# Patient Record
Sex: Female | Born: 1945 | Race: White | Hispanic: No | Marital: Married | Smoking: Never smoker
Health system: Southern US, Community
[De-identification: ages and names within clinical notes are randomized; demographics above are authoritative.]

## PROBLEM LIST (undated history)

## (undated) DIAGNOSIS — Z9989 Dependence on other enabling machines and devices: Secondary | ICD-10-CM

## (undated) DIAGNOSIS — F329 Major depressive disorder, single episode, unspecified: Secondary | ICD-10-CM

## (undated) DIAGNOSIS — K219 Gastro-esophageal reflux disease without esophagitis: Secondary | ICD-10-CM

## (undated) DIAGNOSIS — E669 Obesity, unspecified: Secondary | ICD-10-CM

## (undated) DIAGNOSIS — G4733 Obstructive sleep apnea (adult) (pediatric): Secondary | ICD-10-CM

## (undated) DIAGNOSIS — E668 Other obesity: Secondary | ICD-10-CM

## (undated) DIAGNOSIS — J3800 Paralysis of vocal cords and larynx, unspecified: Secondary | ICD-10-CM

## (undated) DIAGNOSIS — G4730 Sleep apnea, unspecified: Secondary | ICD-10-CM

## (undated) DIAGNOSIS — Q250 Patent ductus arteriosus: Secondary | ICD-10-CM

## (undated) DIAGNOSIS — T4145XA Adverse effect of unspecified anesthetic, initial encounter: Secondary | ICD-10-CM

## (undated) DIAGNOSIS — T8859XA Other complications of anesthesia, initial encounter: Secondary | ICD-10-CM

## (undated) DIAGNOSIS — Z87898 Personal history of other specified conditions: Secondary | ICD-10-CM

## (undated) DIAGNOSIS — F32A Depression, unspecified: Secondary | ICD-10-CM

## (undated) DIAGNOSIS — E785 Hyperlipidemia, unspecified: Secondary | ICD-10-CM

## (undated) DIAGNOSIS — Z972 Presence of dental prosthetic device (complete) (partial): Secondary | ICD-10-CM

## (undated) HISTORY — PX: PATENT DUCTUS ARTERIOUS REPAIR: SHX269

## (undated) HISTORY — DX: Personal history of other specified conditions: Z87.898

## (undated) HISTORY — DX: Other obesity: E66.8

## (undated) HISTORY — PX: DILATION AND CURETTAGE OF UTERUS: SHX78

## (undated) HISTORY — DX: Obesity, unspecified: E66.9

## (undated) HISTORY — DX: Obstructive sleep apnea (adult) (pediatric): Z99.89

## (undated) HISTORY — DX: Depression, unspecified: F32.A

## (undated) HISTORY — DX: Obstructive sleep apnea (adult) (pediatric): G47.33

## (undated) HISTORY — PX: OTHER SURGICAL HISTORY: SHX169

## (undated) HISTORY — DX: Hyperlipidemia, unspecified: E78.5

## (undated) HISTORY — PX: ABDOMINAL HYSTERECTOMY: SHX81

---
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
## 1999-02-05 ENCOUNTER — Encounter: Payer: Self-pay | Admitting: Gynecology

## 1999-02-05 ENCOUNTER — Encounter: Admission: RE | Admit: 1999-02-05 | Discharge: 1999-02-05 | Payer: Self-pay | Admitting: Gynecology

## 2000-02-24 ENCOUNTER — Other Ambulatory Visit: Admission: RE | Admit: 2000-02-24 | Discharge: 2000-02-24 | Payer: Self-pay | Admitting: Gynecology

## 2000-08-03 ENCOUNTER — Encounter: Admission: RE | Admit: 2000-08-03 | Discharge: 2000-08-03 | Payer: Self-pay | Admitting: Gynecology

## 2000-08-03 ENCOUNTER — Encounter: Payer: Self-pay | Admitting: Gynecology

## 2001-11-11 ENCOUNTER — Encounter: Admission: RE | Admit: 2001-11-11 | Discharge: 2001-11-11 | Payer: Self-pay | Admitting: Gynecology

## 2001-11-11 ENCOUNTER — Encounter: Payer: Self-pay | Admitting: Gynecology

## 2003-02-21 ENCOUNTER — Encounter: Admission: RE | Admit: 2003-02-21 | Discharge: 2003-02-21 | Payer: Self-pay | Admitting: Gynecology

## 2003-04-24 ENCOUNTER — Other Ambulatory Visit: Admission: RE | Admit: 2003-04-24 | Discharge: 2003-04-24 | Payer: Self-pay | Admitting: Gynecology

## 2004-09-26 ENCOUNTER — Encounter: Admission: RE | Admit: 2004-09-26 | Discharge: 2004-09-26 | Payer: Self-pay | Admitting: Gynecology

## 2005-09-29 ENCOUNTER — Encounter: Admission: RE | Admit: 2005-09-29 | Discharge: 2005-09-29 | Payer: Self-pay | Admitting: Gynecology

## 2006-11-17 ENCOUNTER — Encounter: Admission: RE | Admit: 2006-11-17 | Discharge: 2006-11-17 | Payer: Self-pay | Admitting: Internal Medicine

## 2007-11-22 ENCOUNTER — Encounter: Admission: RE | Admit: 2007-11-22 | Discharge: 2007-11-22 | Payer: Self-pay | Admitting: Internal Medicine

## 2008-01-03 ENCOUNTER — Encounter: Admission: RE | Admit: 2008-01-03 | Discharge: 2008-01-03 | Payer: Self-pay | Admitting: Internal Medicine

## 2008-11-22 ENCOUNTER — Encounter: Admission: RE | Admit: 2008-11-22 | Discharge: 2008-11-22 | Payer: Self-pay | Admitting: Internal Medicine

## 2009-11-26 ENCOUNTER — Encounter: Admission: RE | Admit: 2009-11-26 | Discharge: 2009-11-26 | Payer: Self-pay | Admitting: Gynecology

## 2010-10-30 ENCOUNTER — Other Ambulatory Visit: Payer: Self-pay | Admitting: Gynecology

## 2010-10-30 DIAGNOSIS — Z1231 Encounter for screening mammogram for malignant neoplasm of breast: Secondary | ICD-10-CM

## 2010-11-29 ENCOUNTER — Ambulatory Visit
Admission: RE | Admit: 2010-11-29 | Discharge: 2010-11-29 | Disposition: A | Discharge status: Home / Self Care | Payer: BC Managed Care – PPO | Source: Ambulatory Visit | Attending: Gynecology | Admitting: Gynecology

## 2010-11-29 DIAGNOSIS — Z1231 Encounter for screening mammogram for malignant neoplasm of breast: Secondary | ICD-10-CM

## 2011-10-27 ENCOUNTER — Other Ambulatory Visit: Payer: Self-pay | Admitting: Internal Medicine

## 2011-10-27 DIAGNOSIS — Z1231 Encounter for screening mammogram for malignant neoplasm of breast: Secondary | ICD-10-CM

## 2011-12-01 ENCOUNTER — Ambulatory Visit
Admission: RE | Admit: 2011-12-01 | Discharge: 2011-12-01 | Disposition: A | Discharge status: Home / Self Care | Payer: BC Managed Care – PPO | Source: Ambulatory Visit | Attending: Internal Medicine | Admitting: Internal Medicine

## 2011-12-01 DIAGNOSIS — Z1231 Encounter for screening mammogram for malignant neoplasm of breast: Secondary | ICD-10-CM

## 2011-12-03 ENCOUNTER — Other Ambulatory Visit: Payer: Self-pay | Admitting: Internal Medicine

## 2011-12-03 DIAGNOSIS — R928 Other abnormal and inconclusive findings on diagnostic imaging of breast: Secondary | ICD-10-CM

## 2011-12-09 ENCOUNTER — Ambulatory Visit
Admission: RE | Admit: 2011-12-09 | Discharge: 2011-12-09 | Disposition: A | Discharge status: Home / Self Care | Payer: BC Managed Care – PPO | Source: Ambulatory Visit | Attending: Internal Medicine | Admitting: Internal Medicine

## 2011-12-09 DIAGNOSIS — R928 Other abnormal and inconclusive findings on diagnostic imaging of breast: Secondary | ICD-10-CM

## 2012-01-01 ENCOUNTER — Encounter (HOSPITAL_COMMUNITY): Payer: Self-pay | Admitting: Pharmacy Technician

## 2012-01-02 ENCOUNTER — Encounter (HOSPITAL_COMMUNITY): Payer: Self-pay | Admitting: *Deleted

## 2012-01-06 ENCOUNTER — Encounter (HOSPITAL_COMMUNITY): Payer: Self-pay | Admitting: *Deleted

## 2012-01-06 ENCOUNTER — Encounter (HOSPITAL_COMMUNITY)
Admission: RE | Disposition: A | Discharge status: Home / Self Care | Payer: Self-pay | Source: Ambulatory Visit | Attending: Gastroenterology

## 2012-01-06 ENCOUNTER — Ambulatory Visit (HOSPITAL_COMMUNITY)
Admission: RE | Admit: 2012-01-06 | Discharge: 2012-01-06 | Disposition: A | Discharge status: Home / Self Care | Payer: BC Managed Care – PPO | Source: Ambulatory Visit | Attending: Gastroenterology | Admitting: Gastroenterology

## 2012-01-06 ENCOUNTER — Encounter (HOSPITAL_COMMUNITY): Payer: Self-pay | Admitting: Anesthesiology

## 2012-01-06 ENCOUNTER — Ambulatory Visit (HOSPITAL_COMMUNITY): Payer: BC Managed Care – PPO | Admitting: Anesthesiology

## 2012-01-06 DIAGNOSIS — Z79899 Other long term (current) drug therapy: Secondary | ICD-10-CM | POA: Insufficient documentation

## 2012-01-06 DIAGNOSIS — G4733 Obstructive sleep apnea (adult) (pediatric): Secondary | ICD-10-CM | POA: Insufficient documentation

## 2012-01-06 DIAGNOSIS — Z1211 Encounter for screening for malignant neoplasm of colon: Secondary | ICD-10-CM | POA: Insufficient documentation

## 2012-01-06 DIAGNOSIS — K219 Gastro-esophageal reflux disease without esophagitis: Secondary | ICD-10-CM | POA: Insufficient documentation

## 2012-01-06 HISTORY — DX: Other complications of anesthesia, initial encounter: T88.59XA

## 2012-01-06 HISTORY — DX: Patent ductus arteriosus: Q25.0

## 2012-01-06 HISTORY — DX: Sleep apnea, unspecified: G47.30

## 2012-01-06 HISTORY — DX: Gastro-esophageal reflux disease without esophagitis: K21.9

## 2012-01-06 HISTORY — DX: Paralysis of vocal cords and larynx, unspecified: J38.00

## 2012-01-06 HISTORY — PX: COLONOSCOPY WITH PROPOFOL: SHX5780

## 2012-01-06 HISTORY — DX: Adverse effect of unspecified anesthetic, initial encounter: T41.45XA

## 2012-01-06 SURGERY — COLONOSCOPY WITH PROPOFOL
Anesthesia: Monitor Anesthesia Care

## 2012-01-06 MED ORDER — KETAMINE HCL 10 MG/ML IJ SOLN
INTRAMUSCULAR | Status: DC | PRN
  Administered 2012-01-06: 10 mg via INTRAVENOUS

## 2012-01-06 MED ORDER — SODIUM CHLORIDE 0.9 % IV SOLN: INTRAVENOUS | Status: DC

## 2012-01-06 MED ORDER — PROMETHAZINE HCL 25 MG/ML IJ SOLN: 6.2500 mg | INTRAMUSCULAR | Status: DC | PRN

## 2012-01-06 MED ORDER — LACTATED RINGERS IV SOLN: INTRAVENOUS | Status: DC

## 2012-01-06 MED ORDER — FENTANYL CITRATE 0.05 MG/ML IJ SOLN
INTRAMUSCULAR | Status: DC | PRN
  Administered 2012-01-06: 50 ug via INTRAVENOUS

## 2012-01-06 MED ORDER — LACTATED RINGERS IV SOLN
INTRAVENOUS | Status: DC | PRN
  Administered 2012-01-06: 13:00:00 via INTRAVENOUS

## 2012-01-06 MED ORDER — PROPOFOL 10 MG/ML IV EMUL
INTRAVENOUS | Status: DC | PRN
  Administered 2012-01-06: 120 ug/kg/min via INTRAVENOUS

## 2012-01-06 MED ORDER — MEPERIDINE HCL 100 MG/ML IJ SOLN: 6.2500 mg | INTRAMUSCULAR | Status: DC | PRN

## 2012-01-06 MED ORDER — MIDAZOLAM HCL 5 MG/5ML IJ SOLN
INTRAMUSCULAR | Status: DC | PRN
  Administered 2012-01-06: 2 mg via INTRAVENOUS

## 2012-01-06 SURGICAL SUPPLY — 21 items

## 2012-01-06 NOTE — Op Note (Signed)
Procedure: Screening colonoscopy  Endoscopist: Danise Edge  Premedication: Propofol administered by anesthesia  Procedure: The patient was placed in the left lateral decubitus position. Anal inspection and digital rectal exam were normal. The Pentax pediatric colonoscope was introduced into the rectum and easily advanced to the cecum. A normal-appearing ileocecal valve and appendiceal orifice were identified. Colonic preparation for the exam today was good.  Rectum. Normal. Retroflexed view of the distal rectum normal.  Sigmoid colon and descending colon. Normal.  Splenic flexure. Normal.  Transverse colon. Normal.  Hepatic flexure. Normal.  Ascending colon. Normal.  Cecum and ileocecal valve. Normal.  Assessment: Normal screening proctocolonoscopy to the cecum.  Recommendation: Schedule repeat screening colonoscopy in 10 years.

## 2012-01-06 NOTE — Transfer of Care (Signed)
Immediate Anesthesia Transfer of Care Note  Patient: Isabel Fleming  Procedure(s) Performed: Procedure(s) (LRB) with comments: COLONOSCOPY WITH PROPOFOL (N/A)  Patient Location: PACU  Anesthesia Type:MAC  Level of Consciousness: awake, alert , oriented and patient cooperative  Airway & Oxygen Therapy: Patient Spontanous Breathing and Patient connected to face mask oxygen  Post-op Assessment: Report given to PACU RN and Post -op Vital signs reviewed and stable  Post vital signs: Reviewed and stable  Complications: No apparent anesthesia complications

## 2012-01-06 NOTE — Anesthesia Postprocedure Evaluation (Signed)
  Anesthesia Post-op Note  Patient: Isabel Fleming  Procedure(s) Performed: Procedure(s) (LRB): COLONOSCOPY WITH PROPOFOL (N/A)  Patient Location: PACU  Anesthesia Type: MAC  Level of Consciousness: awake and alert   Airway and Oxygen Therapy: Patient Spontanous Breathing  Post-op Pain: mild  Post-op Assessment: Post-op Vital signs reviewed, Patient's Cardiovascular Status Stable, Respiratory Function Stable, Patent Airway and No signs of Nausea or vomiting  Post-op Vital Signs: stable  Complications: No apparent anesthesia complications

## 2012-01-06 NOTE — H&P (Signed)
  Procedure: Screening colonoscopy.  History: The patient is a 66 year old female born [birth year]. The patient is scheduled to undergo a repeat screening colonoscopy with polypectomy to prevent colon cancer. She underwent a normal screening colonoscopy in September 2002.  I discussed with the patient the complications associated with screening colonoscopy including intestinal bleeding, intestinal perforation, and oversedation.  Medication allergies: None.  Chronic medications: Loratadine. Fish oil. Fluticasone nasal spray. Omeprazole. Multivitamin.  Past medical and surgical history: Hysterectomy. Oophorectomy. D&C. Obstructive sleep apnea. Depression. Chronic hoarseness. Vertigo.  Exam: The patient is alert and lying comfortably on the endoscopy stretcher. Sclera are nonicteric. Lungs are clear to auscultation. Cardiac exam reveals a regular rhythm. Abdomen is soft, flat, and nontender to palpation in all quadrants.  Plan: Proceed with screening colonoscopy using propofol sedation as scheduled.

## 2012-01-06 NOTE — Anesthesia Preprocedure Evaluation (Addendum)
Anesthesia Evaluation  Patient identified by MRN, date of birth, ID band Patient awake    Reviewed: Allergy & Precautions, H&P , NPO status , Patient's Chart, lab work & pertinent test results  History of Anesthesia Complications Negative for: history of anesthetic complications  Airway Mallampati: II TM Distance: >3 FB Neck ROM: Full    Dental No notable dental hx. (+) Edentulous Upper and Upper Dentures   Pulmonary neg pulmonary ROS, sleep apnea and Continuous Positive Airway Pressure Ventilation ,  breath sounds clear to auscultation  Pulmonary exam normal       Cardiovascular negative cardio ROS  Rhythm:Regular Rate:Normal  S/p PDA repair.   Neuro/Psych Paralyzed vocal cord negative neurological ROS  negative psych ROS   GI/Hepatic negative GI ROS, Neg liver ROS, GERD- (mild)  ,  Endo/Other  negative endocrine ROS  Renal/GU negative Renal ROS  negative genitourinary   Musculoskeletal negative musculoskeletal ROS (+)   Abdominal   Peds negative pediatric ROS (+)  Hematology negative hematology ROS (+)   Anesthesia Other Findings   Reproductive/Obstetrics negative OB ROS                           Anesthesia Physical Anesthesia Plan  ASA: II  Anesthesia Plan: MAC   Post-op Pain Management:    Induction: Intravenous  Airway Management Planned: Simple Face Mask  Additional Equipment:   Intra-op Plan:   Post-operative Plan: Extubation in OR  Informed Consent: I have reviewed the patients History and Physical, chart, labs and discussed the procedure including the risks, benefits and alternatives for the proposed anesthesia with the patient or authorized representative who has indicated his/her understanding and acceptance.   Dental advisory given  Plan Discussed with: CRNA  Anesthesia Plan Comments:         Anesthesia Quick Evaluation

## 2012-01-06 NOTE — Preoperative (Signed)
Beta Blockers   Reason not to administer Beta Blockers:Not Applicable 

## 2012-01-08 ENCOUNTER — Encounter (HOSPITAL_COMMUNITY): Payer: Self-pay | Admitting: Gastroenterology

## 2012-07-13 ENCOUNTER — Encounter (HOSPITAL_BASED_OUTPATIENT_CLINIC_OR_DEPARTMENT_OTHER): Payer: Self-pay | Admitting: *Deleted

## 2012-07-13 NOTE — Progress Notes (Signed)
No heart or resp problems-does use a cpap and will bring dos and use post op

## 2012-07-14 NOTE — H&P (Signed)
[facility] [address]   [address] [place], [place] [address] [phone] A Division of [facility]  Loreta Ave, M.D.   Robert A. Thurston Hole, M.D.   Burnell Blanks, M.D.   Eulas Post, M.D.   Lunette Stands, M.D Buford Dresser, M.D.  Charlsie Quest, M.D.   Estell Harpin, M.D.   Melina Fiddler, M.D. Genene Churn. Barry Dienes, PA-C            Kirstin A. Shepperson, PA-C Josh Clearfield, PA-C [place], [place]   RE: Isabel Fleming, Isabel Fleming                                [phone]      DOB: 02-07-1946 PROGRESS NOTE: 06-01-12 [age] year-old white female who comes into the office today with complaints of left knee pain and right shoulder pain.  Last seen for left knee on January 13, 2012 and we had discussed getting an MRI scan if she continued to be symptomatic.  Continues to complain of medial knee pain with squatting, pivoting and stairs.  Some feeling of catching and locking.  Right shoulder pain ongoing for about a month and started after she had been doing some raking.  Pain started a few hours after the activity.  Pain currently with overhead activity and reaching behind her back.  Pain does awaken her at night when she lies on her right shoulder.  No cervical spine or radicular component.  No feeling of instability.  No previous problems before onset.    EXAMINATION: Pleasant white female, alert and oriented x 3 and in no acute distress.  No increase in respiratory effort.  Cervical spine unremarkable.  Right shoulder she has good range of motion, but with discomfort.  Positive impingement test.  Negative drop arm.  Pain with supraspinatus resistance.  Left knee good range of motion.  Some swelling without large effusion.  She is exquisitely tender at the medial joint line.  Positive McMurray's.  Ligaments stable.  Bilateral calves non-tender.  Neurovascularly intact.  Skin warm and dry.   X-RAYS: Right shoulder, AP, outlet  and axillary views, show a Type II acromion with some AC degenerative changes.  No acute changes.    IMPRESSION: 1. Right shoulder pain secondary to impingement.  2. Left knee pain, possibly due to medial meniscus tear.   PLAN:  For her knee we will schedule an MRI scan to rule out medial meniscus tear and she will follow up after to discuss results and treatment options.  We offered conservative treatment for her shoulder with injection.  We will recheck her shoulder when she follows up for her knee.  All questions answered.    PROCEDURE NOTE: The patient's clinical condition is marked by substantial pain and/or significant functional disability.  Other conservative therapy has not provided relief, is contraindicated, or not appropriate.  There is a reasonable likelihood that injection will significantly improve the patient's pain and/or functional disability. After patient consent the right shoulder was prepped with Betadine after using 3 cc of 1% Xylocaine for local anesthetic, subacromial 1:4 Depo-Medrol/Marcaine injection performed from a posterolateral approach.  Tolerated procedure well without complication.   Loreta Ave, M.D.   Electronically verified by Loreta Ave, M.D. DFM(JMO):jjh D 06-02-12 T 06-03-12  [facility] [address]   [address] [place], [place] [address] [phone] A Division  of [facility]  Loreta Ave, M.D.   Robert A. Thurston Hole, M.D.   Burnell Blanks, M.D.   Eulas Post, M.D.   Lunette Stands, M.D Buford Dresser, M.D.  Charlsie Quest, M.D.   Estell Harpin, M.D.   Melina Fiddler, M.D. Genene Churn. Barry Dienes, PA-C            Kirstin A. Shepperson, PA-C Josh [place], PA-C [place], [place]   RE: Isabel Fleming, Isabel Fleming                                [phone]      DOB: August 19, 1945 PROGRESS NOTE: 06-11-12 [age] year-old white female with a history of left knee pain and right  shoulder pain.  Returns.  Left knee MRI scan on June 04, 2012 showed complex tear of the body of the medial meniscus with extrusion.  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  Knee symptoms are unchanged from previous visit.  She continues to have ongoing pain and a feeling of mechanical symptoms.  Right shoulder is feeling much better after subacromial Depo-Medrol/Marcaine injection.    EXAMINATION: Pleasant white female, alert and oriented x 3 and in no acute distress.  Right shoulder she has good range of motion.  Left knee less tender with impingement testing.  Negative drop arm.  Left knee exam is unchanged from previous visit.    DISPOSITION:  For her knee, patient advised that the best treatment option at this point would be outpatient arthroscopy with debridement and partial medial meniscectomy.  Understands that we must be cautiously optimistic due to the degenerative changes that she has.  Surgical procedure, along with potential rehab/recovery time discussed.  Pre-op paperwork filled out.  We will continue to keep an eye on her shoulder and if this becomes more symptomatic we did discuss scheduling an MRI scan to better evaluate her rotator cuff.  All questions answered.    Loreta Ave, M.D.   Electronically verified by Loreta Ave, M.D. DFM(JMO):jjh D 06-14-12 T 06-14-12

## 2012-07-15 ENCOUNTER — Encounter (HOSPITAL_BASED_OUTPATIENT_CLINIC_OR_DEPARTMENT_OTHER)
Admission: RE | Disposition: A | Discharge status: Home / Self Care | Payer: Self-pay | Source: Ambulatory Visit | Attending: Orthopedic Surgery

## 2012-07-15 ENCOUNTER — Ambulatory Visit (HOSPITAL_BASED_OUTPATIENT_CLINIC_OR_DEPARTMENT_OTHER)
Admission: RE | Admit: 2012-07-15 | Discharge: 2012-07-15 | Disposition: A | Discharge status: Home / Self Care | Payer: BC Managed Care – PPO | Source: Ambulatory Visit | Attending: Orthopedic Surgery | Admitting: Orthopedic Surgery

## 2012-07-15 ENCOUNTER — Encounter (HOSPITAL_BASED_OUTPATIENT_CLINIC_OR_DEPARTMENT_OTHER): Payer: Self-pay | Admitting: Anesthesiology

## 2012-07-15 ENCOUNTER — Ambulatory Visit (HOSPITAL_BASED_OUTPATIENT_CLINIC_OR_DEPARTMENT_OTHER): Payer: BC Managed Care – PPO | Admitting: Anesthesiology

## 2012-07-15 DIAGNOSIS — I2510 Atherosclerotic heart disease of native coronary artery without angina pectoris: Secondary | ICD-10-CM | POA: Insufficient documentation

## 2012-07-15 DIAGNOSIS — K219 Gastro-esophageal reflux disease without esophagitis: Secondary | ICD-10-CM | POA: Insufficient documentation

## 2012-07-15 DIAGNOSIS — M6750 Plica syndrome, unspecified knee: Secondary | ICD-10-CM | POA: Insufficient documentation

## 2012-07-15 DIAGNOSIS — M1710 Unilateral primary osteoarthritis, unspecified knee: Secondary | ICD-10-CM | POA: Insufficient documentation

## 2012-07-15 DIAGNOSIS — IMO0002 Reserved for concepts with insufficient information to code with codable children: Secondary | ICD-10-CM | POA: Insufficient documentation

## 2012-07-15 DIAGNOSIS — S83289A Other tear of lateral meniscus, current injury, unspecified knee, initial encounter: Secondary | ICD-10-CM | POA: Insufficient documentation

## 2012-07-15 DIAGNOSIS — G4730 Sleep apnea, unspecified: Secondary | ICD-10-CM | POA: Insufficient documentation

## 2012-07-15 DIAGNOSIS — X58XXXA Exposure to other specified factors, initial encounter: Secondary | ICD-10-CM | POA: Insufficient documentation

## 2012-07-15 DIAGNOSIS — M25819 Other specified joint disorders, unspecified shoulder: Secondary | ICD-10-CM | POA: Insufficient documentation

## 2012-07-15 DIAGNOSIS — Z9889 Other specified postprocedural states: Secondary | ICD-10-CM

## 2012-07-15 HISTORY — DX: Presence of dental prosthetic device (complete) (partial): Z97.2

## 2012-07-15 HISTORY — PX: KNEE ARTHROSCOPY WITH MEDIAL MENISECTOMY: SHX5651

## 2012-07-15 LAB — POCT HEMOGLOBIN-HEMACUE: Hemoglobin: 13.2 g/dL (ref 12.0–15.0)

## 2012-07-15 SURGERY — ARTHROSCOPY, KNEE, WITH MEDIAL MENISCECTOMY
Anesthesia: General | Site: Knee | Laterality: Left | Wound class: Clean

## 2012-07-15 MED ORDER — FENTANYL CITRATE 0.05 MG/ML IJ SOLN
INTRAMUSCULAR | Status: DC | PRN
  Administered 2012-07-15: 100 ug via INTRAVENOUS
  Administered 2012-07-15: 25 ug via INTRAVENOUS

## 2012-07-15 MED ORDER — CEFAZOLIN SODIUM-DEXTROSE 2-3 GM-% IV SOLR
2.0000 g | INTRAVENOUS | Status: AC
  Administered 2012-07-15: 2 g via INTRAVENOUS

## 2012-07-15 MED ORDER — ONDANSETRON HCL 4 MG/2ML IJ SOLN
INTRAMUSCULAR | Status: DC | PRN
  Administered 2012-07-15: 4 mg via INTRAVENOUS

## 2012-07-15 MED ORDER — HYDROMORPHONE HCL PF 1 MG/ML IJ SOLN
0.2500 mg | INTRAMUSCULAR | Status: DC | PRN
  Administered 2012-07-15: 0.25 mg via INTRAVENOUS

## 2012-07-15 MED ORDER — PROPOFOL 10 MG/ML IV BOLUS
INTRAVENOUS | Status: DC | PRN
  Administered 2012-07-15: 150 mg via INTRAVENOUS

## 2012-07-15 MED ORDER — LACTATED RINGERS IV SOLN
INTRAVENOUS | Status: DC
  Administered 2012-07-15: 07:00:00 via INTRAVENOUS

## 2012-07-15 MED ORDER — MIDAZOLAM HCL 5 MG/5ML IJ SOLN
INTRAMUSCULAR | Status: DC | PRN
  Administered 2012-07-15: 2 mg via INTRAVENOUS

## 2012-07-15 MED ORDER — BUPIVACAINE HCL (PF) 0.5 % IJ SOLN
INTRAMUSCULAR | Status: DC | PRN
  Administered 2012-07-15: 20 mL

## 2012-07-15 MED ORDER — SODIUM CHLORIDE 0.9 % IR SOLN
Status: DC | PRN
  Administered 2012-07-15: 6000 mL

## 2012-07-15 MED ORDER — METHYLPREDNISOLONE ACETATE 80 MG/ML IJ SUSP
INTRAMUSCULAR | Status: DC | PRN
  Administered 2012-07-15: 08:00:00 via INTRA_ARTICULAR

## 2012-07-15 MED ORDER — KETOROLAC TROMETHAMINE 30 MG/ML IJ SOLN
INTRAMUSCULAR | Status: DC | PRN
  Administered 2012-07-15: 30 mg via INTRAVENOUS

## 2012-07-15 MED ORDER — OXYCODONE-ACETAMINOPHEN 5-325 MG PO TABS
1.0000 | ORAL_TABLET | ORAL | Status: DC | PRN
  Administered 2012-07-15: 1 via ORAL

## 2012-07-15 MED ORDER — OXYCODONE-ACETAMINOPHEN 5-325 MG PO TABS: 1.0000 | ORAL_TABLET | ORAL | Status: DC | PRN

## 2012-07-15 MED ORDER — LIDOCAINE HCL (CARDIAC) 20 MG/ML IV SOLN
INTRAVENOUS | Status: DC | PRN
  Administered 2012-07-15: 60 mg via INTRAVENOUS

## 2012-07-15 MED ORDER — DEXAMETHASONE SODIUM PHOSPHATE 4 MG/ML IJ SOLN
INTRAMUSCULAR | Status: DC | PRN
  Administered 2012-07-15: 8 mg via INTRAVENOUS

## 2012-07-15 SURGICAL SUPPLY — 39 items
BANDAGE ELASTIC 6 VELCRO ST LF (GAUZE/BANDAGES/DRESSINGS) ×2 IMPLANT
BLADE CUDA 5.5 (BLADE) IMPLANT
BLADE CUDA GRT WHITE 3.5 (BLADE) IMPLANT
BLADE CUTTER GATOR 3.5 (BLADE) ×2 IMPLANT
BLADE CUTTER MENIS 5.5 (BLADE) IMPLANT
BLADE GREAT WHITE 4.2 (BLADE) ×2 IMPLANT
BUR OVAL 4.0 (BURR) IMPLANT
CANISTER OMNI JUG 16 LITER (MISCELLANEOUS) ×2 IMPLANT
CANISTER SUCTION 2500CC (MISCELLANEOUS) IMPLANT
CLOTH BEACON ORANGE TIMEOUT ST (SAFETY) ×2 IMPLANT
CUTTER MENISCUS  4.2MM (BLADE)
CUTTER MENISCUS 4.2MM (BLADE) IMPLANT
DRAPE ARTHROSCOPY W/POUCH 90 (DRAPES) ×2 IMPLANT
DURAPREP 26ML APPLICATOR (WOUND CARE) ×2 IMPLANT
ELECT MENISCUS 165MM 90D (ELECTRODE) IMPLANT
ELECT REM PT RETURN 9FT ADLT (ELECTROSURGICAL) ×2
ELECTRODE REM PT RTRN 9FT ADLT (ELECTROSURGICAL) ×1 IMPLANT
GAUZE XEROFORM 1X8 LF (GAUZE/BANDAGES/DRESSINGS) ×2 IMPLANT
GLOVE BIO SURGEON STRL SZ 6.5 (GLOVE) ×2 IMPLANT
GLOVE BIOGEL PI IND STRL 7.0 (GLOVE) ×1 IMPLANT
GLOVE BIOGEL PI IND STRL 8 (GLOVE) ×1 IMPLANT
GLOVE BIOGEL PI INDICATOR 7.0 (GLOVE) ×1
GLOVE BIOGEL PI INDICATOR 8 (GLOVE) ×1
GLOVE ORTHO TXT STRL SZ7.5 (GLOVE) ×4 IMPLANT
GOWN PREVENTION PLUS XLARGE (GOWN DISPOSABLE) IMPLANT
GOWN STRL REIN 2XL XLG LVL4 (GOWN DISPOSABLE) IMPLANT
HOLDER KNEE FOAM BLUE (MISCELLANEOUS) ×2 IMPLANT
IV NS IRRIG 3000ML ARTHROMATIC (IV SOLUTION) ×4 IMPLANT
KNEE WRAP E Z 3 GEL PACK (MISCELLANEOUS) ×2 IMPLANT
PACK ARTHROSCOPY DSU (CUSTOM PROCEDURE TRAY) ×2 IMPLANT
PACK BASIN DAY SURGERY FS (CUSTOM PROCEDURE TRAY) ×2 IMPLANT
PENCIL BUTTON HOLSTER BLD 10FT (ELECTRODE) IMPLANT
SET ARTHROSCOPY TUBING (MISCELLANEOUS) ×1
SET ARTHROSCOPY TUBING LN (MISCELLANEOUS) ×1 IMPLANT
SPONGE GAUZE 4X4 12PLY (GAUZE/BANDAGES/DRESSINGS) ×4 IMPLANT
SUT ETHILON 3 0 PS 1 (SUTURE) ×2 IMPLANT
SUT VIC AB 3-0 FS2 27 (SUTURE) IMPLANT
TOWEL OR 17X24 6PK STRL BLUE (TOWEL DISPOSABLE) ×2 IMPLANT
WATER STERILE IRR 1000ML POUR (IV SOLUTION) ×2 IMPLANT

## 2012-07-15 NOTE — Anesthesia Preprocedure Evaluation (Addendum)
Anesthesia Evaluation  Patient identified by MRN, date of birth, ID band Patient awake    Reviewed: Allergy & Precautions, NPO status   Airway Mallampati: II      Dental   Pulmonary sleep apnea and Continuous Positive Airway Pressure Ventilation ,          Cardiovascular + CAD  History of Patent Ductus repair. CE   Neuro/Psych    GI/Hepatic Neg liver ROS, GERD-  ,  Endo/Other  negative endocrine ROS  Renal/GU negative Renal ROS     Musculoskeletal   Abdominal   Peds  Hematology   Anesthesia Other Findings   Reproductive/Obstetrics                         Anesthesia Physical Anesthesia Plan  ASA: III  Anesthesia Plan: General   Post-op Pain Management:    Induction: Intravenous  Airway Management Planned: LMA  Additional Equipment:   Intra-op Plan:   Post-operative Plan:   Informed Consent:   Plan Discussed with: CRNA and Anesthesiologist  Anesthesia Plan Comments:         Anesthesia Quick Evaluation

## 2012-07-15 NOTE — Interval H&P Note (Signed)
History and Physical Interval Note:  07/15/2012 7:30 AM  Isabel Fleming  has presented today for surgery, with the diagnosis of LEFT KNEE MEDIAL/LATERAL MENISCUS TEAR  The various methods of treatment have been discussed with the patient and family. After consideration of risks, benefits and other options for treatment, the patient has consented to  Procedure(s): LEFT KNEE ARTHROSCOPY WITH MEDIAL/LATERAL MENISECTOMY (Left) as a surgical intervention .  The patient's history has been reviewed, patient examined, no change in status, stable for surgery.  I have reviewed the patient's chart and labs.  Questions were answered to the patient's satisfaction.     Wyley Hack F

## 2012-07-15 NOTE — Progress Notes (Signed)
Crutch teaching performed via Peggy RN/ Pt fitted for crutches.

## 2012-07-15 NOTE — Brief Op Note (Signed)
07/15/2012  8:23 AM  PATIENT:  Isabel Fleming  67 y.o. female  PRE-OPERATIVE DIAGNOSIS:  LEFT KNEE MEDIAL/LATERAL MENISCUS TEAR  POST-OPERATIVE DIAGNOSIS:  LEFT KNEE MEDIAL/LATERALMENISCUS TEAR;  ARTHRITIS -PATELLA  PROCEDURE:  Procedure(s): LEFT KNEE ARTHROSCOPY WITH MEDIAL/LATERAL MENISECTOMY, PATELLA CHONDROPLASTY (Left)  SURGEON:  Surgeon(s) and Role:    * Loreta Ave, MD - Primary  PHYSICIAN ASSISTANT: Zonia Kief M     ANESTHESIA:   general  EBL:  Total I/O In: 1200 [I.V.:1200] Out: -    SPECIMEN:  No Specimen  DISPOSITION OF SPECIMEN:  N/A  COUNTS:  YES  TOURNIQUET:  * No tourniquets in log *   PATIENT DISPOSITION:  PACU - hemodynamically stable.

## 2012-07-15 NOTE — Anesthesia Postprocedure Evaluation (Signed)
  Anesthesia Post-op Note  Patient: Isabel Fleming  Procedure(s) Performed: Procedure(s): LEFT KNEE ARTHROSCOPY WITH MEDIAL/LATERAL MENISECTOMY, PATELLA CHONDROPLASTY (Left)  Patient Location: PACU  Anesthesia Type:General  Level of Consciousness: awake  Airway and Oxygen Therapy: Patient Spontanous Breathing  Post-op Pain: mild  Post-op Assessment: Post-op Vital signs reviewed  Post-op Vital Signs: Reviewed  Complications: No apparent anesthesia complications

## 2012-07-15 NOTE — Transfer of Care (Signed)
Immediate Anesthesia Transfer of Care Note  Patient: Isabel Fleming  Procedure(s) Performed: Procedure(s): LEFT KNEE ARTHROSCOPY WITH MEDIAL/LATERAL MENISECTOMY, PATELLA CHONDROPLASTY (Left)  Patient Location: PACU  Anesthesia Type:General  Level of Consciousness: awake and sedated  Airway & Oxygen Therapy: Patient Spontanous Breathing and Patient connected to face mask oxygen  Post-op Assessment: Report given to PACU RN and Post -op Vital signs reviewed and stable  Post vital signs: Reviewed and stable  Complications: No apparent anesthesia complications

## 2012-07-15 NOTE — Anesthesia Procedure Notes (Signed)
Procedure Name: LMA Insertion Performed by: Briana Newman W Pre-anesthesia Checklist: Patient identified, Timeout performed, Emergency Drugs available, Suction available and Patient being monitored Patient Re-evaluated:Patient Re-evaluated prior to inductionOxygen Delivery Method: Circle system utilized Preoxygenation: Pre-oxygenation with 100% oxygen Intubation Type: IV induction Ventilation: Mask ventilation without difficulty LMA: LMA with gastric port inserted LMA Size: 4.0 Number of attempts: 1 Placement Confirmation: breath sounds checked- equal and bilateral and positive ETCO2 Tube secured with: Tape Dental Injury: Teeth and Oropharynx as per pre-operative assessment      

## 2012-07-16 ENCOUNTER — Encounter (HOSPITAL_BASED_OUTPATIENT_CLINIC_OR_DEPARTMENT_OTHER): Payer: Self-pay | Admitting: Orthopedic Surgery

## 2012-07-16 NOTE — Op Note (Signed)
NAME:  Isabel Fleming, Isabel Fleming NO.:  [id number]  MEDICAL RECORD NO.:  [id number]  LOCATION:                                 FACILITY:  PHYSICIAN:  Loreta Ave, M.D. DATE OF BIRTH:  11-09-1945  DATE OF PROCEDURE:  07/15/2012 DATE OF DISCHARGE:  07/15/2012                              OPERATIVE REPORT   PREOPERATIVE DIAGNOSES:  Left knee medial meniscus tear with moderate degenerative changes patellofemoral joint, medial compartment.  POSTOPERATIVE DIAGNOSES:  Left knee medial meniscus tear with moderate degenerative changes patellofemoral joint, medial compartment with some grade II, mild grade 3 changes patellofemoral joint, weightbearing dome medial femoral condyle and a focal grade 3 lesion in the middle of the lateral tibial plateau.  Also radial tearing of the lateral meniscus with marked complex tearing posterior medial meniscus, large medial plica.  PROCEDURE:  Left knee exam under anesthesia, arthroscopy.  Chondroplasty patella, medial femoral condyle lateral tibial plateau.  Partial medial lateral meniscectomies.  Excision of medial plica.  SURGEON:  Loreta Ave, M.D.  ASSISTANT:  Genene Churn. Barry Dienes, [place].  ANESTHESIA:  General.  BLOOD LOSS:  Minimal.  SPECIMENS:  None.  CULTURES:  None.  COMPLICATION:  None.  DRESSINGS:  Soft compressive.  PROCEDURE IN DETAIL:  The patient was brought to the operating room, placed on the operating table in supine position.  After adequate anesthesia had been obtained, leg holder applied.  Leg prepped and draped in usual sterile fashion.  Two portals created, 1 each medial and lateral parapatellar.  Arthroscope was introduced.  Knee was distended and inspected.  Diffuse grade II and III  changes of the patellofemoral joint debrided.  Large fibrotic plica excised.  Complex tearing medial meniscus entire posterior half.  Taken out to a stable rim, tapered into remaining meniscus.  Radial tearing, anterior  middle third lateral meniscus, saucerized tapered smoothly.  Focal relatively deep grade 3 lesion, lateral tibial plateau debrided.  Thorough chondroplasty throughout.  All chondral loose bodies removed.  Instruments were removed.  Portals were closed with nylon. Knee was injected with Depo-Medrol Marcaine.  Sterile compressive dressing was applied.  Anesthesia reversed.  Brought to the recovery room.  Tolerated the surgery well with no complications.     Loreta Ave, M.D.     DFM/MEDQ  D:  07/15/2012  T:  07/16/2012  Job:  [id number]

## 2012-11-03 ENCOUNTER — Other Ambulatory Visit: Payer: Self-pay

## 2012-11-03 DIAGNOSIS — Z1231 Encounter for screening mammogram for malignant neoplasm of breast: Secondary | ICD-10-CM

## 2012-12-01 ENCOUNTER — Ambulatory Visit
Admission: RE | Admit: 2012-12-01 | Discharge: 2012-12-01 | Disposition: A | Discharge status: Home / Self Care | Payer: BC Managed Care – PPO | Source: Ambulatory Visit

## 2012-12-01 DIAGNOSIS — Z1231 Encounter for screening mammogram for malignant neoplasm of breast: Secondary | ICD-10-CM

## 2013-05-02 ENCOUNTER — Other Ambulatory Visit: Payer: Self-pay | Admitting: Physician Assistant

## 2013-05-06 ENCOUNTER — Encounter (HOSPITAL_BASED_OUTPATIENT_CLINIC_OR_DEPARTMENT_OTHER): Payer: Self-pay | Admitting: *Deleted

## 2013-05-06 NOTE — Progress Notes (Signed)
Pt has no cardiac or resp problems-does use a cpap-will bring and overnight bag in case she has to stay-had knee here 5/14 did well- Chronic hoarseness

## 2013-05-11 NOTE — H&P (Signed)
[facility] [address] [place], [place] [address] [phone] A Division of [facility]  Ninetta Lights, M.D.   Robert A. Noemi Chapel, M.D.   Faythe Casa, M.D.   Johnny Bridge, M.D.   Almedia Balls, M.D Ernesta Amble. Percell Miller, M.D.  Joseph Pierini, M.D.  Lanier Prude, M.D.    Verner Chol, M.D. Mary L. Fenton Malling, PA-C  Kirstin A. Shepperson, PA-C  Josh Adelphi, PA-C [place], [place]  RE: Verdean, Isabel Fleming   [phone]      DOB: 1945-12-06 PROGRESS NOTE: 12-24-12  Alycea comes in for her right shoulder.  Issue of impingement.  I initially saw after knee surgery where her shoulder was aggravated using crutches.  She had some symptoms prior to that event treated with subacromial injection by me back in March.  Things had resolved until this flare up.  Her knee is doing well and she is coming back in because the shoulder is persisting.  She is maintaining reasonable motion and strength.  There have been underlying issues in her cervical spine but based on the response to her injection from a diagnostic point of view, I really felt that this was a picture of impingement and shoulder problems rather than radicular from her neck.  She continues to have functional shoulder symptoms especially bringing the arm overhead.  Some neck pain, some referred pain down the arm but it really tends to focus around the shoulder.    Remaining history, workup and treatment to date has been outlined and reviewed in [facility].   EXAMINATION: On her exam today, a little decreased cervical motion but she is neurovascularly intact in the upper and lower extremities.  The right shoulder has positive impingement, positive palms down abduction, biceps soreness.  No demonstrable atrophy and she can get through fairly good, not quite full motion.  Opposite shoulder has full motion without cuff irritation.     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    DISPOSITION: Although there is underlying degenerative disc disease of the cervical spine and some overlay from that, I think her primary issue is her shoulder rather than her neck.  This has reached the point that I want to pursue definitive workup.  MRI scan to look at her cuff and other structures.  She will follow-up when that is complete.  Depending on findings, we will tailor further treatment.  I think therapy and repeat injections are an option but I want to get a better handle on how bad her cuff looks as she is starting to get more pain at rest than at night.  She understands and agrees.      Ninetta Lights, M.D.  Electronically verified by Ninetta Lights, M.D. DFM:gde D 12-24-12 T 12-27-12 Cc:  Nehemiah Settle, MD fax [place],  [address] [phone] A Division of [facility]  Ninetta Lights, M.D.   Robert A. Noemi Chapel, M.D.   Faythe Casa, M.D.   Johnny Bridge, M.D.   Almedia Balls, M.D Ernesta Amble. Percell Miller, M.D.  Joseph Pierini, M.D.  Lanier Prude, M.D.    Verner Chol, M.D. Mary L. Fenton Malling, PA-C  Kirstin A. Shepperson, PA-C  Josh Descanso, PA-C [place], [place]  RE: Jason, [place]  [phone]      DOB: 12/09/1945 PROGRESS NOTE: 01-04-13  Sabrena comes in for follow-up.  I reviewed the MRI of her right shoulder.  Although she has had longstanding weakness and some discomfort in the shoulder, this was really exacerbated when she was using crutches after her knee surgery.  This has continued.  Her biggest issue is pain.  She can bring the arm overhead, has overhead weakness but it is the rest pain, night pain and functional pain that is most traumatic.    Her knee after arthroscopic debridement back in May continues to be tolerable and better.   Her  MRI is complete.  Large full thickness retracted tear in the supraspinatus tendon with marked muscle atrophy.  Intrasubstance tear of the sub scapularis with subluxation of the long head of the biceps into the tear.  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    EXAMINATION: On the exam if she goes slowly, she can still get overhead although there is definite cuff weakness globally throughout.  Markedly positive impingement, problems on abduction.    DISPOSITION: Long talk with Vaughan Basta.  I explained that this is probably not reparable.  The goal here is to try to achieve some pain relief.  We have touched upon salvage procedure of her right shoulder but I think a trial of debriding arthroscopy needs to be done first.  We discussed exam under anesthesia, arthroscopy, decompression and distal clavicle excision.  Probable release of the biceps.  Repair her cuff if it is found that this is possible but I really doubt that.  Procedure, risks, benefits, complications are reviewed in detail.  Paperwork complete.  Questions answered.  I will see her at the time of operative intervention.     Ninetta Lights, M.D.  Electronically verified by Ninetta Lights, M.D. DFM:gde D 01-04-13 T 01-04-13  05/11/13   History, physical, diagnostic tests reviewed. No significant interval change.

## 2013-05-12 ENCOUNTER — Ambulatory Visit (HOSPITAL_BASED_OUTPATIENT_CLINIC_OR_DEPARTMENT_OTHER): Payer: BC Managed Care – PPO | Admitting: Anesthesiology

## 2013-05-12 ENCOUNTER — Encounter (HOSPITAL_BASED_OUTPATIENT_CLINIC_OR_DEPARTMENT_OTHER)
Admission: RE | Disposition: A | Discharge status: Home / Self Care | Payer: Self-pay | Source: Ambulatory Visit | Attending: Orthopedic Surgery

## 2013-05-12 ENCOUNTER — Ambulatory Visit (HOSPITAL_BASED_OUTPATIENT_CLINIC_OR_DEPARTMENT_OTHER)
Admission: RE | Admit: 2013-05-12 | Discharge: 2013-05-12 | Disposition: A | Discharge status: Home / Self Care | Payer: BC Managed Care – PPO | Source: Ambulatory Visit | Attending: Orthopedic Surgery | Admitting: Orthopedic Surgery

## 2013-05-12 ENCOUNTER — Encounter (HOSPITAL_BASED_OUTPATIENT_CLINIC_OR_DEPARTMENT_OTHER): Payer: BC Managed Care – PPO | Admitting: Anesthesiology

## 2013-05-12 ENCOUNTER — Encounter (HOSPITAL_BASED_OUTPATIENT_CLINIC_OR_DEPARTMENT_OTHER): Payer: Self-pay | Admitting: *Deleted

## 2013-05-12 DIAGNOSIS — M75120 Complete rotator cuff tear or rupture of unspecified shoulder, not specified as traumatic: Secondary | ICD-10-CM | POA: Insufficient documentation

## 2013-05-12 DIAGNOSIS — M7580 Other shoulder lesions, unspecified shoulder: Principal | ICD-10-CM

## 2013-05-12 DIAGNOSIS — M5030 Other cervical disc degeneration, unspecified cervical region: Secondary | ICD-10-CM | POA: Insufficient documentation

## 2013-05-12 DIAGNOSIS — K219 Gastro-esophageal reflux disease without esophagitis: Secondary | ICD-10-CM | POA: Insufficient documentation

## 2013-05-12 DIAGNOSIS — G4730 Sleep apnea, unspecified: Secondary | ICD-10-CM | POA: Insufficient documentation

## 2013-05-12 DIAGNOSIS — M19019 Primary osteoarthritis, unspecified shoulder: Secondary | ICD-10-CM | POA: Insufficient documentation

## 2013-05-12 DIAGNOSIS — M25819 Other specified joint disorders, unspecified shoulder: Secondary | ICD-10-CM | POA: Insufficient documentation

## 2013-05-12 DIAGNOSIS — M66329 Spontaneous rupture of flexor tendons, unspecified upper arm: Secondary | ICD-10-CM | POA: Insufficient documentation

## 2013-05-12 DIAGNOSIS — M7541 Impingement syndrome of right shoulder: Secondary | ICD-10-CM

## 2013-05-12 HISTORY — PX: SHOULDER ARTHROSCOPY WITH SUBACROMIAL DECOMPRESSION, ROTATOR CUFF REPAIR AND BICEP TENDON REPAIR: SHX5687

## 2013-05-12 LAB — POCT HEMOGLOBIN-HEMACUE: Hemoglobin: 14.7 g/dL (ref 12.0–15.0)

## 2013-05-12 SURGERY — SHOULDER ARTHROSCOPY WITH SUBACROMIAL DECOMPRESSION, ROTATOR CUFF REPAIR AND BICEP TENDON REPAIR
Anesthesia: General | Site: Shoulder | Laterality: Right

## 2013-05-12 MED ORDER — FENTANYL CITRATE 0.05 MG/ML IJ SOLN: 50.0000 ug | INTRAMUSCULAR | Status: DC | PRN

## 2013-05-12 MED ORDER — MIDAZOLAM HCL 2 MG/2ML IJ SOLN: 1.0000 mg | INTRAMUSCULAR | Status: DC | PRN

## 2013-05-12 MED ORDER — HYDROMORPHONE HCL PF 1 MG/ML IJ SOLN
0.2500 mg | INTRAMUSCULAR | Status: DC | PRN
  Administered 2013-05-12 (×2): 0.5 mg via INTRAVENOUS

## 2013-05-12 MED ORDER — BISACODYL 5 MG PO TBEC: 5.0000 mg | DELAYED_RELEASE_TABLET | Freq: Every day | ORAL | Status: AC | PRN

## 2013-05-12 MED ORDER — OXYCODONE HCL 5 MG/5ML PO SOLN: 5.0000 mg | Freq: Once | ORAL | Status: AC | PRN

## 2013-05-12 MED ORDER — LACTATED RINGERS IV SOLN: INTRAVENOUS | Status: DC

## 2013-05-12 MED ORDER — ONDANSETRON HCL 4 MG PO TABS: 4.0000 mg | ORAL_TABLET | Freq: Three times a day (TID) | ORAL | Status: AC | PRN

## 2013-05-12 MED ORDER — FENTANYL CITRATE 0.05 MG/ML IJ SOLN
INTRAMUSCULAR | Status: AC
  Filled 2013-05-12: qty 4

## 2013-05-12 MED ORDER — BUPIVACAINE HCL (PF) 0.5 % IJ SOLN
INTRAMUSCULAR | Status: DC | PRN
  Administered 2013-05-12: 20 mL

## 2013-05-12 MED ORDER — LIDOCAINE HCL (CARDIAC) 20 MG/ML IV SOLN
INTRAVENOUS | Status: DC | PRN
  Administered 2013-05-12: 100 mg via INTRAVENOUS

## 2013-05-12 MED ORDER — METHYLPREDNISOLONE ACETATE 80 MG/ML IJ SUSP
INTRAMUSCULAR | Status: AC
  Filled 2013-05-12: qty 1

## 2013-05-12 MED ORDER — METOCLOPRAMIDE HCL 5 MG/ML IJ SOLN: 5.0000 mg | Freq: Three times a day (TID) | INTRAMUSCULAR | Status: DC | PRN

## 2013-05-12 MED ORDER — METOCLOPRAMIDE HCL 5 MG PO TABS: 5.0000 mg | ORAL_TABLET | Freq: Three times a day (TID) | ORAL | Status: DC | PRN

## 2013-05-12 MED ORDER — FENTANYL CITRATE 0.05 MG/ML IJ SOLN
INTRAMUSCULAR | Status: DC | PRN
  Administered 2013-05-12: 50 ug via INTRAVENOUS
  Administered 2013-05-12: 100 ug via INTRAVENOUS
  Administered 2013-05-12: 50 ug via INTRAVENOUS

## 2013-05-12 MED ORDER — HYDROMORPHONE HCL PF 1 MG/ML IJ SOLN: 0.5000 mg | INTRAMUSCULAR | Status: DC | PRN

## 2013-05-12 MED ORDER — METHYLPREDNISOLONE ACETATE 80 MG/ML IJ SUSP
INTRAMUSCULAR | Status: DC | PRN
  Administered 2013-05-12: 80 mg

## 2013-05-12 MED ORDER — SUCCINYLCHOLINE CHLORIDE 20 MG/ML IJ SOLN
INTRAMUSCULAR | Status: DC | PRN
  Administered 2013-05-12: 100 mg via INTRAVENOUS

## 2013-05-12 MED ORDER — PROPOFOL 10 MG/ML IV BOLUS
INTRAVENOUS | Status: DC | PRN
  Administered 2013-05-12: 200 mg via INTRAVENOUS
  Administered 2013-05-12: 50 mg via INTRAVENOUS

## 2013-05-12 MED ORDER — DEXAMETHASONE SODIUM PHOSPHATE 4 MG/ML IJ SOLN
INTRAMUSCULAR | Status: DC | PRN
  Administered 2013-05-12: 10 mg via INTRAVENOUS

## 2013-05-12 MED ORDER — OXYCODONE HCL 5 MG PO TABS
5.0000 mg | ORAL_TABLET | Freq: Once | ORAL | Status: AC | PRN
  Administered 2013-05-12: 5 mg via ORAL

## 2013-05-12 MED ORDER — METHOCARBAMOL 100 MG/ML IJ SOLN: 500.0000 mg | Freq: Four times a day (QID) | INTRAVENOUS | Status: DC | PRN

## 2013-05-12 MED ORDER — CHLORHEXIDINE GLUCONATE 4 % EX LIQD: 60.0000 mL | Freq: Once | CUTANEOUS | Status: DC

## 2013-05-12 MED ORDER — OXYCODONE-ACETAMINOPHEN 5-325 MG PO TABS: 1.0000 | ORAL_TABLET | ORAL | Status: DC | PRN

## 2013-05-12 MED ORDER — ONDANSETRON HCL 4 MG/2ML IJ SOLN: 4.0000 mg | Freq: Four times a day (QID) | INTRAMUSCULAR | Status: DC | PRN

## 2013-05-12 MED ORDER — METOCLOPRAMIDE HCL 5 MG/ML IJ SOLN: 10.0000 mg | Freq: Once | INTRAMUSCULAR | Status: DC | PRN

## 2013-05-12 MED ORDER — EPHEDRINE SULFATE 50 MG/ML IJ SOLN
INTRAMUSCULAR | Status: DC | PRN
  Administered 2013-05-12 (×3): 10 mg via INTRAVENOUS

## 2013-05-12 MED ORDER — OXYCODONE HCL 5 MG PO TABS
ORAL_TABLET | ORAL | Status: AC
  Filled 2013-05-12: qty 1

## 2013-05-12 MED ORDER — MIDAZOLAM HCL 2 MG/2ML IJ SOLN
INTRAMUSCULAR | Status: AC
  Filled 2013-05-12: qty 2

## 2013-05-12 MED ORDER — LACTATED RINGERS IV SOLN
INTRAVENOUS | Status: DC
  Administered 2013-05-12 (×2): via INTRAVENOUS

## 2013-05-12 MED ORDER — SODIUM CHLORIDE 0.9 % IR SOLN
Status: DC | PRN
  Administered 2013-05-12: 7000 mL

## 2013-05-12 MED ORDER — BUPIVACAINE HCL (PF) 0.5 % IJ SOLN
INTRAMUSCULAR | Status: AC
  Filled 2013-05-12: qty 30

## 2013-05-12 MED ORDER — MIDAZOLAM HCL 5 MG/5ML IJ SOLN
INTRAMUSCULAR | Status: DC | PRN
  Administered 2013-05-12: 2 mg via INTRAVENOUS

## 2013-05-12 MED ORDER — ONDANSETRON HCL 4 MG PO TABS: 4.0000 mg | ORAL_TABLET | Freq: Four times a day (QID) | ORAL | Status: DC | PRN

## 2013-05-12 MED ORDER — METHOCARBAMOL 500 MG PO TABS: 500.0000 mg | ORAL_TABLET | Freq: Four times a day (QID) | ORAL | Status: DC | PRN

## 2013-05-12 MED ORDER — HYDROMORPHONE HCL PF 1 MG/ML IJ SOLN
INTRAMUSCULAR | Status: AC
  Filled 2013-05-12: qty 1

## 2013-05-12 MED ORDER — CEFAZOLIN SODIUM-DEXTROSE 2-3 GM-% IV SOLR
2.0000 g | INTRAVENOUS | Status: AC
  Administered 2013-05-12: 2 g via INTRAVENOUS

## 2013-05-12 SURGICAL SUPPLY — 71 items
BENZOIN TINCTURE PRP APPL 2/3 (GAUZE/BANDAGES/DRESSINGS) IMPLANT
BLADE CUTTER GATOR 3.5 (BLADE) ×2 IMPLANT
BLADE CUTTER MENIS 5.5 (BLADE) IMPLANT
BLADE GREAT WHITE 4.2 (BLADE) ×2 IMPLANT
BLADE SURG 15 STRL LF DISP TIS (BLADE) ×1 IMPLANT
BLADE SURG 15 STRL SS (BLADE) ×1
BUR OVAL 6.0 (BURR) ×2 IMPLANT
CANISTER SUCT 3000ML (MISCELLANEOUS) IMPLANT
CANNULA DRY DOC 8X75 (CANNULA) IMPLANT
CANNULA TWIST IN 8.25X7CM (CANNULA) IMPLANT
DECANTER SPIKE VIAL GLASS SM (MISCELLANEOUS) IMPLANT
DRAPE STERI 35X30 U-POUCH (DRAPES) ×2 IMPLANT
DRAPE U-SHAPE 47X51 STRL (DRAPES) ×2 IMPLANT
DRAPE U-SHAPE 76X120 STRL (DRAPES) ×4 IMPLANT
DRSG PAD ABDOMINAL 8X10 ST (GAUZE/BANDAGES/DRESSINGS) ×2 IMPLANT
DURAPREP 26ML APPLICATOR (WOUND CARE) ×2 IMPLANT
ELECT MENISCUS 165MM 90D (ELECTRODE) ×2 IMPLANT
ELECT NEEDLE TIP 2.8 STRL (NEEDLE) IMPLANT
ELECT REM PT RETURN 9FT ADLT (ELECTROSURGICAL) ×2
ELECTRODE REM PT RTRN 9FT ADLT (ELECTROSURGICAL) ×1 IMPLANT
GAUZE XEROFORM 1X8 LF (GAUZE/BANDAGES/DRESSINGS) ×2 IMPLANT
GLOVE BIO SURGEON STRL SZ7 (GLOVE) ×2 IMPLANT
GLOVE BIOGEL PI IND STRL 7.0 (GLOVE) ×1 IMPLANT
GLOVE BIOGEL PI IND STRL 7.5 (GLOVE) ×1 IMPLANT
GLOVE BIOGEL PI INDICATOR 7.0 (GLOVE) ×1
GLOVE BIOGEL PI INDICATOR 7.5 (GLOVE) ×1
GLOVE ECLIPSE 6.5 STRL STRAW (GLOVE) ×2 IMPLANT
GLOVE EXAM NITRILE MD LF STRL (GLOVE) ×2 IMPLANT
GLOVE ORTHO TXT STRL SZ7.5 (GLOVE) ×4 IMPLANT
GOWN STRL REUS W/ TWL LRG LVL3 (GOWN DISPOSABLE) ×2 IMPLANT
GOWN STRL REUS W/ TWL XL LVL3 (GOWN DISPOSABLE) ×1 IMPLANT
GOWN STRL REUS W/TWL LRG LVL3 (GOWN DISPOSABLE) ×2
GOWN STRL REUS W/TWL XL LVL3 (GOWN DISPOSABLE) ×1
IV NS IRRIG 3000ML ARTHROMATIC (IV SOLUTION) ×8 IMPLANT
MANIFOLD NEPTUNE II (INSTRUMENTS) ×2 IMPLANT
NDL SUT 6 .5 CRC .975X.05 MAYO (NEEDLE) IMPLANT
NEEDLE MAYO TAPER (NEEDLE)
NEEDLE SCORPION MULTI FIRE (NEEDLE) IMPLANT
NS IRRIG 1000ML POUR BTL (IV SOLUTION) IMPLANT
PACK ARTHROSCOPY DSU (CUSTOM PROCEDURE TRAY) ×2 IMPLANT
PACK BASIN DAY SURGERY FS (CUSTOM PROCEDURE TRAY) ×2 IMPLANT
PASSER SUT SWANSON 36MM LOOP (INSTRUMENTS) IMPLANT
PENCIL BUTTON HOLSTER BLD 10FT (ELECTRODE) ×2 IMPLANT
SET ARTHROSCOPY TUBING (MISCELLANEOUS) ×1
SET ARTHROSCOPY TUBING LN (MISCELLANEOUS) ×1 IMPLANT
SLEEVE SCD COMPRESS KNEE MED (MISCELLANEOUS) IMPLANT
SLING ARM IMMOBILIZER LRG (SOFTGOODS) IMPLANT
SLING ARM IMMOBILIZER MED (SOFTGOODS) IMPLANT
SLING ARM LRG ADULT FOAM STRAP (SOFTGOODS) ×2 IMPLANT
SLING ARM MED ADULT FOAM STRAP (SOFTGOODS) IMPLANT
SLING ARM XL FOAM STRAP (SOFTGOODS) IMPLANT
SPONGE GAUZE 4X4 12PLY (GAUZE/BANDAGES/DRESSINGS) ×4 IMPLANT
SPONGE LAP 4X18 X RAY DECT (DISPOSABLE) IMPLANT
STRIP CLOSURE SKIN 1/2X4 (GAUZE/BANDAGES/DRESSINGS) IMPLANT
SUCTION FRAZIER TIP 10 FR DISP (SUCTIONS) IMPLANT
SUT ETHIBOND 2 OS 4 DA (SUTURE) IMPLANT
SUT ETHILON 2 0 FS 18 (SUTURE) IMPLANT
SUT ETHILON 3 0 PS 1 (SUTURE) IMPLANT
SUT FIBERWIRE #2 38 T-5 BLUE (SUTURE)
SUT RETRIEVER MED (INSTRUMENTS) IMPLANT
SUT TIGER TAPE 7 IN WHITE (SUTURE) IMPLANT
SUT VIC AB 0 CT1 27 (SUTURE)
SUT VIC AB 0 CT1 27XBRD ANBCTR (SUTURE) IMPLANT
SUT VIC AB 2-0 SH 27 (SUTURE)
SUT VIC AB 2-0 SH 27XBRD (SUTURE) IMPLANT
SUT VIC AB 3-0 FS2 27 (SUTURE) IMPLANT
SUTURE FIBERWR #2 38 T-5 BLUE (SUTURE) IMPLANT
TAPE FIBER 2MM 7IN #2 BLUE (SUTURE) IMPLANT
TOWEL OR 17X24 6PK STRL BLUE (TOWEL DISPOSABLE) ×2 IMPLANT
WATER STERILE IRR 1000ML POUR (IV SOLUTION) ×2 IMPLANT
YANKAUER SUCT BULB TIP NO VENT (SUCTIONS) IMPLANT

## 2013-05-12 NOTE — Discharge Instructions (Signed)
Shouder arthroscopy, partial rotator cuff tear debridement subacromial decompression Care After Instructions Refer to this sheet in the next few weeks. These discharge instructions provide you with general information on caring for yourself after you leave the hospital. Your caregiver may also give you specific instructions. Your treatment has been planned according to the most current medical practices available, but unavoidable complications sometimes occur. If you have any problems or questions after discharge, please call your caregiver. HOME INSTRUCTIONS You may resume a normal diet and activities as directed. Take showers instead of baths until informed otherwise. Can take showers starting on Sunday. Change bandages (dressings) in 3 days.  Swab wounds daily with betadine.  Wash shoulder with soap and water.  Pat dry.  Cover wounds with bandaids. Only take over-the-counter or prescription medicines for pain, discomfort, or fever as directed by your caregiver.  Wear your sling for the next 1-2 days unless otherwise instructed. Eat a well-balanced diet.  Avoid lifting or driving until you are instructed otherwise.  Make an appointment to see your caregiver for stitches (suture) or staple removal as directed.   SEEK MEDICAL CARE IF: You have swelling of your calf or leg.  You develop shortness of breath or chest pain.  You have redness, swelling, or increasing pain in the wound.  There is pus or any unusual drainage coming from the surgical site.  You notice a bad smell coming from the surgical site or dressing.  The surgical site breaks open after sutures or staples have been removed.  There is persistent bleeding from the suture or staple line.  You are getting worse or are not improving.  You have any other questions or concerns.  SEEK IMMEDIATE MEDICAL CARE IF:  You have a fever greater than 101 You develop a rash.  You have difficulty breathing.  You develop any reaction or side  effects to medicines given.  Your knee motion is decreasing rather than improving.  MAKE SURE YOU:  Understand these instructions.  Will watch your condition.  Will get help right away if you are not doing well or get worse.    Post Anesthesia Home Care Instructions  Activity: Get plenty of rest for the remainder of the day. A responsible adult should stay with you for 24 hours following the procedure.  For the next 24 hours, DO NOT: -Drive a car -Paediatric nurse -Drink alcoholic beverages -Take any medication unless instructed by your physician -Make any legal decisions or sign important papers.  Meals: Start with liquid foods such as gelatin or soup. Progress to regular foods as tolerated. Avoid greasy, spicy, heavy foods. If nausea and/or vomiting occur, drink only clear liquids until the nausea and/or vomiting subsides. Call your physician if vomiting continues.  Special Instructions/Symptoms: Your throat may feel dry or sore from the anesthesia or the breathing tube placed in your throat during surgery. If this causes discomfort, gargle with warm salt water. The discomfort should disappear within 24 hours.

## 2013-05-12 NOTE — Anesthesia Preprocedure Evaluation (Signed)
Anesthesia Evaluation  Patient identified by MRN, date of birth, ID band Patient awake    Reviewed: Allergy & Precautions, H&P , NPO status , Patient's Chart, lab work & pertinent test results, reviewed documented beta blocker date and time   History of Anesthesia Complications (+) history of anesthetic complications  Airway Mallampati: II TM Distance: >3 FB Neck ROM: full    Dental   Pulmonary sleep apnea and Continuous Positive Airway Pressure Ventilation ,  breath sounds clear to auscultation        Cardiovascular negative cardio ROS  Rhythm:regular     Neuro/Psych negative neurological ROS  negative psych ROS   GI/Hepatic negative GI ROS, Neg liver ROS, GERD-  Medicated and Controlled,  Endo/Other  Morbid obesity  Renal/GU negative Renal ROS  negative genitourinary   Musculoskeletal   Abdominal   Peds  Hematology negative hematology ROS (+)   Anesthesia Other Findings See surgeon's H&P   Reproductive/Obstetrics negative OB ROS                           Anesthesia Physical Anesthesia Plan  ASA: III  Anesthesia Plan: General   Post-op Pain Management:    Induction: Intravenous  Airway Management Planned: Oral ETT and Video Laryngoscope Planned  Additional Equipment:   Intra-op Plan:   Post-operative Plan: Extubation in OR  Informed Consent: I have reviewed the patients History and Physical, chart, labs and discussed the procedure including the risks, benefits and alternatives for the proposed anesthesia with the patient or authorized representative who has indicated his/her understanding and acceptance.   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 CF)        Anesthesia Quick Evaluation

## 2013-05-12 NOTE — Transfer of Care (Signed)
Immediate Anesthesia Transfer of Care Note  Patient: Isabel Fleming  Procedure(s) Performed: Procedure(s): RIGHT SHOULDER ARTHROSCOPY WITH DEBRIDEMENT ROTATOR CUFF, RELEASE BICEPS, DISTAL CLAVICLE RESECTION, SUBACROMIAL DECOMPRESSION (Right)  Patient Location: PACU  Anesthesia Type:General  Level of Consciousness: sedated  Airway & Oxygen Therapy: Patient Spontanous Breathing and Patient connected to face mask oxygen  Post-op Assessment: Report given to PACU RN and Post -op Vital signs reviewed and stable  Post vital signs: Reviewed and stable  Complications: No apparent anesthesia complications

## 2013-05-12 NOTE — Anesthesia Procedure Notes (Signed)
Procedure Name: Intubation Date/Time: 05/12/2013 8:48 AM Performed by: Lieutenant Diego Pre-anesthesia Checklist: Patient identified, Emergency Drugs available, Suction available and Patient being monitored Patient Re-evaluated:Patient Re-evaluated prior to inductionOxygen Delivery Method: Circle System Utilized Preoxygenation: Pre-oxygenation with 100% oxygen Intubation Type: IV induction Ventilation: Mask ventilation without difficulty Laryngoscope size: glide scope. Grade View: Grade I Tube type: Oral Number of attempts: 1 Airway Equipment and Method: stylet and oral airway Placement Confirmation: ETT inserted through vocal cords under direct vision,  positive ETCO2 and breath sounds checked- equal and bilateral Secured at: 22 cm Tube secured with: Tape Dental Injury: Teeth and Oropharynx as per pre-operative assessment  Comments: Pt has history for paralyzed vocal cord, several ent procedures. Used glide scope electively. ETT passed with ease, good view of cords.

## 2013-05-12 NOTE — Interval H&P Note (Signed)
History and Physical Interval Note:  05/12/2013 7:26 AM  Isabel Fleming  has presented today for surgery, with the diagnosis of right shoulder rotator cuff tear/impingement syndrome/biceps tendonitis  The various methods of treatment have been discussed with the patient and family. After consideration of risks, benefits and other options for treatment, the patient has consented to  Procedure(s): RIGHT SHOULDER ARTHROSCOPY WITH DEBRIDEMENT, DISTAL CLAVICLE EXCISION, SUBACROMIAL DECOMPRESSION, ROTATOR CUFF REPAIR AND BICEP TENODESIS ARTHROSCOPIC (Right) as a surgical intervention .  The patient's history has been reviewed, patient examined, no change in status, stable for surgery.  I have reviewed the patient's chart and labs.  Questions were answered to the patient's satisfaction.     MURPHY,DANIEL F

## 2013-05-12 NOTE — Anesthesia Postprocedure Evaluation (Signed)
Anesthesia Post Note  Patient: Isabel Fleming  Procedure(s) Performed: Procedure(s) (LRB): RIGHT SHOULDER ARTHROSCOPY WITH DEBRIDEMENT ROTATOR CUFF, RELEASE BICEPS, DISTAL CLAVICLE RESECTION, SUBACROMIAL DECOMPRESSION (Right)  Anesthesia type: General  Patient location: PACU  Post pain: Pain level controlled  Post assessment: Patient's Cardiovascular Status Stable  Last Vitals:  Filed Vitals:   05/12/13 1115  BP: 101/41  Pulse: 88  Temp:   Resp: 15    Post vital signs: Reviewed and stable  Level of consciousness: alert  Complications: No apparent anesthesia complications

## 2013-05-13 ENCOUNTER — Encounter (HOSPITAL_BASED_OUTPATIENT_CLINIC_OR_DEPARTMENT_OTHER): Payer: Self-pay | Admitting: Orthopedic Surgery

## 2013-05-16 NOTE — Op Note (Signed)
NAME:  TAMANA, HATFIELD NO.:  [id number]  MEDICAL RECORD NO.:  [id number]  LOCATION:                                 FACILITY:  PHYSICIAN:  Ninetta Lights, M.D. DATE OF BIRTH:  October 04, 1945  DATE OF PROCEDURE:  05/12/2013 DATE OF DISCHARGE:  05/12/2013                              OPERATIVE REPORT   PREOPERATIVE DIAGNOSES: 1. Right shoulder painful, chronic irreparable rotator cuff tear.     Impingement. 2. Degenerative joint disease, acromioclavicular joint. 3. Partial tearing, long head biceps tendon.  POSTOPERATIVE DIAGNOSES: 1. Right shoulder painful, chronic irreparable rotator cuff tear.     Impingement. 2. Degenerative joint disease, acromioclavicular joint. 3. Partial tearing, long head biceps tendon. 4. Degenerative tearing, labrum, grade 2 and 3 changes glenohumeral     joint mostly on the glenoid.  PROCEDURES: 1. Right shoulder exam under anesthesia, arthroscopy. 2. Debridement of rotator cuff, labrum, glenohumeral joint. 3. Release resection intra-articular portion, biceps tendon. 4. Bursectomy. 5. Acromioplasty. 6. Coracoacromial ligament release. 7. Excision of distal clavicle.  SURGEON:  Ninetta Lights, MD  ASSISTANT:  Eula Listen PA, present throughout the entire case and necessary for timely completion of procedure.  ANESTHESIA:  General.  ESTIMATED BLOOD LOSS:  Minimal.  SPECIMENS:  None.  CULTURES:  None.  COMPLICATIONS:  None.  DRESSINGS:  Soft compressive with sling.  DESCRIPTION OF PROCEDURE:  The patient was brought to the operating room, placed on the operating table in supine position.  After adequate anesthesia had been obtained, placed in a beach-chair position on the shoulder positioner, prepped and draped in usual sterile fashion. Shoulder examined.  Little stiff, but full motion and stable shoulder. After being prepped and draped, three portals, anterior, posterior, and lateral.  Arthroscope introduced,  shoulder distended and inspected.  The entire supraspinatus, top of the infraspinatus, and subscap were torn, retracted, degenerative, nothing that could be opposed, nothing that could be repaired.  Debrided back to healthy tissue.  Intra-articular portion of long head biceps had marked tearing attrition throughout and subluxed laterally.  Intra-articular portion released, resected, allowing the tendon to slide down into the bicipital groove for all tenodesis.  Type 2 acromion.  Bursa resected.  I released the CA ligament just enough to facilitate acromioplasty to a type 1 acromion. Periarticular spurs lateral centimeter of the grade 4 changes AC joint resected.  Adequacy of debridement and decompression confirmed. Instruments and fluid were removed.  Portals and shoulder injected with Marcaine.  Portals were closed with nylon.  Sterile compressive dressing applied.  Sling applied.  Anesthesia reversed.  Brought to the recovery room.  Tolerated the surgery well.  No complications.     Ninetta Lights, M.D.   ______________________________ Ninetta Lights, M.D.    DFM/MEDQ  D:  05/12/2013  T:  05/13/2013  Job:  [id number]

## 2013-11-23 ENCOUNTER — Other Ambulatory Visit: Payer: Self-pay

## 2013-11-23 DIAGNOSIS — Z1231 Encounter for screening mammogram for malignant neoplasm of breast: Secondary | ICD-10-CM

## 2013-12-02 ENCOUNTER — Ambulatory Visit
Admission: RE | Admit: 2013-12-02 | Discharge: 2013-12-02 | Disposition: A | Discharge status: Home / Self Care | Payer: BC Managed Care – PPO | Source: Ambulatory Visit

## 2013-12-02 DIAGNOSIS — Z1231 Encounter for screening mammogram for malignant neoplasm of breast: Secondary | ICD-10-CM

## 2014-11-06 ENCOUNTER — Other Ambulatory Visit: Payer: Self-pay

## 2014-11-06 DIAGNOSIS — Z1231 Encounter for screening mammogram for malignant neoplasm of breast: Secondary | ICD-10-CM

## 2014-12-04 ENCOUNTER — Ambulatory Visit
Admission: RE | Admit: 2014-12-04 | Discharge: 2014-12-04 | Disposition: A | Discharge status: Home / Self Care | Payer: BLUE CROSS/BLUE SHIELD | Source: Ambulatory Visit

## 2014-12-04 DIAGNOSIS — Z1231 Encounter for screening mammogram for malignant neoplasm of breast: Secondary | ICD-10-CM

## 2015-11-08 ENCOUNTER — Other Ambulatory Visit: Payer: Self-pay | Admitting: Obstetrics and Gynecology

## 2015-11-08 DIAGNOSIS — Z1231 Encounter for screening mammogram for malignant neoplasm of breast: Secondary | ICD-10-CM

## 2015-12-06 ENCOUNTER — Ambulatory Visit
Admission: RE | Admit: 2015-12-06 | Discharge: 2015-12-06 | Disposition: A | Discharge status: Home / Self Care | Payer: BLUE CROSS/BLUE SHIELD | Source: Ambulatory Visit | Attending: Obstetrics and Gynecology | Admitting: Obstetrics and Gynecology

## 2015-12-06 DIAGNOSIS — Z1231 Encounter for screening mammogram for malignant neoplasm of breast: Secondary | ICD-10-CM

## 2016-03-19 DIAGNOSIS — M940 Chondrocostal junction syndrome [Tietze]: Secondary | ICD-10-CM | POA: Diagnosis not present

## 2016-03-19 DIAGNOSIS — M542 Cervicalgia: Secondary | ICD-10-CM | POA: Diagnosis not present

## 2016-03-19 DIAGNOSIS — J209 Acute bronchitis, unspecified: Secondary | ICD-10-CM | POA: Diagnosis not present

## 2016-03-28 ENCOUNTER — Other Ambulatory Visit: Payer: Self-pay | Admitting: Internal Medicine

## 2016-03-28 ENCOUNTER — Ambulatory Visit
Admission: RE | Admit: 2016-03-28 | Discharge: 2016-03-28 | Disposition: A | Discharge status: Home / Self Care | Payer: Medicare HMO | Source: Ambulatory Visit | Attending: Internal Medicine | Admitting: Internal Medicine

## 2016-03-28 DIAGNOSIS — R69 Illness, unspecified: Secondary | ICD-10-CM | POA: Diagnosis not present

## 2016-03-28 DIAGNOSIS — R0781 Pleurodynia: Secondary | ICD-10-CM | POA: Diagnosis not present

## 2016-03-28 DIAGNOSIS — R0789 Other chest pain: Secondary | ICD-10-CM

## 2016-03-28 DIAGNOSIS — R071 Chest pain on breathing: Principal | ICD-10-CM

## 2016-03-28 DIAGNOSIS — J383 Other diseases of vocal cords: Secondary | ICD-10-CM | POA: Diagnosis not present

## 2016-03-28 DIAGNOSIS — Z1239 Encounter for other screening for malignant neoplasm of breast: Secondary | ICD-10-CM | POA: Diagnosis not present

## 2016-03-28 DIAGNOSIS — R079 Chest pain, unspecified: Secondary | ICD-10-CM | POA: Diagnosis not present

## 2016-03-28 DIAGNOSIS — K589 Irritable bowel syndrome without diarrhea: Secondary | ICD-10-CM | POA: Diagnosis not present

## 2016-03-28 DIAGNOSIS — Z1211 Encounter for screening for malignant neoplasm of colon: Secondary | ICD-10-CM | POA: Diagnosis not present

## 2016-03-28 DIAGNOSIS — R5382 Chronic fatigue, unspecified: Secondary | ICD-10-CM | POA: Diagnosis not present

## 2016-03-28 DIAGNOSIS — G4733 Obstructive sleep apnea (adult) (pediatric): Secondary | ICD-10-CM | POA: Diagnosis not present

## 2016-03-28 DIAGNOSIS — Z6836 Body mass index (BMI) 36.0-36.9, adult: Secondary | ICD-10-CM | POA: Diagnosis not present

## 2016-03-28 DIAGNOSIS — Z0000 Encounter for general adult medical examination without abnormal findings: Secondary | ICD-10-CM | POA: Diagnosis not present

## 2016-04-04 DIAGNOSIS — E785 Hyperlipidemia, unspecified: Secondary | ICD-10-CM | POA: Diagnosis not present

## 2016-04-04 DIAGNOSIS — J3089 Other allergic rhinitis: Secondary | ICD-10-CM | POA: Diagnosis not present

## 2016-04-04 DIAGNOSIS — I700 Atherosclerosis of aorta: Secondary | ICD-10-CM | POA: Diagnosis not present

## 2016-04-04 DIAGNOSIS — R498 Other voice and resonance disorders: Secondary | ICD-10-CM | POA: Diagnosis not present

## 2016-06-02 DIAGNOSIS — G4733 Obstructive sleep apnea (adult) (pediatric): Secondary | ICD-10-CM | POA: Diagnosis not present

## 2016-06-02 DIAGNOSIS — R002 Palpitations: Secondary | ICD-10-CM | POA: Diagnosis not present

## 2016-10-29 ENCOUNTER — Other Ambulatory Visit: Payer: Self-pay | Admitting: Internal Medicine

## 2016-10-29 DIAGNOSIS — Z1239 Encounter for other screening for malignant neoplasm of breast: Secondary | ICD-10-CM

## 2016-11-20 DIAGNOSIS — J9811 Atelectasis: Secondary | ICD-10-CM | POA: Diagnosis not present

## 2016-11-20 DIAGNOSIS — R490 Dysphonia: Secondary | ICD-10-CM | POA: Diagnosis not present

## 2016-11-20 DIAGNOSIS — M47819 Spondylosis without myelopathy or radiculopathy, site unspecified: Secondary | ICD-10-CM | POA: Diagnosis not present

## 2016-11-20 DIAGNOSIS — Z6837 Body mass index (BMI) 37.0-37.9, adult: Secondary | ICD-10-CM | POA: Diagnosis not present

## 2016-11-20 DIAGNOSIS — J387 Other diseases of larynx: Secondary | ICD-10-CM | POA: Diagnosis not present

## 2016-11-20 DIAGNOSIS — J383 Other diseases of vocal cords: Secondary | ICD-10-CM | POA: Diagnosis not present

## 2016-12-08 ENCOUNTER — Ambulatory Visit
Admission: RE | Admit: 2016-12-08 | Discharge: 2016-12-08 | Disposition: A | Discharge status: Home / Self Care | Payer: Medicare HMO | Source: Ambulatory Visit | Attending: Internal Medicine | Admitting: Internal Medicine

## 2016-12-08 DIAGNOSIS — Z1239 Encounter for other screening for malignant neoplasm of breast: Secondary | ICD-10-CM

## 2016-12-08 DIAGNOSIS — Z1231 Encounter for screening mammogram for malignant neoplasm of breast: Secondary | ICD-10-CM | POA: Diagnosis not present

## 2017-02-04 DIAGNOSIS — L309 Dermatitis, unspecified: Secondary | ICD-10-CM | POA: Diagnosis not present

## 2017-02-04 DIAGNOSIS — L249 Irritant contact dermatitis, unspecified cause: Secondary | ICD-10-CM | POA: Diagnosis not present

## 2017-05-14 DIAGNOSIS — N951 Menopausal and female climacteric states: Secondary | ICD-10-CM | POA: Diagnosis not present

## 2017-05-14 DIAGNOSIS — R635 Abnormal weight gain: Secondary | ICD-10-CM | POA: Diagnosis not present

## 2017-05-19 DIAGNOSIS — Z1331 Encounter for screening for depression: Secondary | ICD-10-CM | POA: Diagnosis not present

## 2017-05-19 DIAGNOSIS — M2550 Pain in unspecified joint: Secondary | ICD-10-CM | POA: Diagnosis not present

## 2017-05-19 DIAGNOSIS — Z1339 Encounter for screening examination for other mental health and behavioral disorders: Secondary | ICD-10-CM | POA: Diagnosis not present

## 2017-05-19 DIAGNOSIS — R5383 Other fatigue: Secondary | ICD-10-CM | POA: Diagnosis not present

## 2017-05-19 DIAGNOSIS — Z6836 Body mass index (BMI) 36.0-36.9, adult: Secondary | ICD-10-CM | POA: Diagnosis not present

## 2017-05-19 DIAGNOSIS — E782 Mixed hyperlipidemia: Secondary | ICD-10-CM | POA: Diagnosis not present

## 2017-05-19 DIAGNOSIS — G4730 Sleep apnea, unspecified: Secondary | ICD-10-CM | POA: Diagnosis not present

## 2017-09-30 DIAGNOSIS — L814 Other melanin hyperpigmentation: Secondary | ICD-10-CM | POA: Diagnosis not present

## 2017-09-30 DIAGNOSIS — B353 Tinea pedis: Secondary | ICD-10-CM | POA: Diagnosis not present

## 2017-09-30 DIAGNOSIS — Z85828 Personal history of other malignant neoplasm of skin: Secondary | ICD-10-CM | POA: Diagnosis not present

## 2017-09-30 DIAGNOSIS — D225 Melanocytic nevi of trunk: Secondary | ICD-10-CM | POA: Diagnosis not present

## 2017-09-30 DIAGNOSIS — L821 Other seborrheic keratosis: Secondary | ICD-10-CM | POA: Diagnosis not present

## 2017-09-30 DIAGNOSIS — L239 Allergic contact dermatitis, unspecified cause: Secondary | ICD-10-CM | POA: Diagnosis not present

## 2017-11-04 DIAGNOSIS — R42 Dizziness and giddiness: Secondary | ICD-10-CM | POA: Diagnosis not present

## 2017-11-04 DIAGNOSIS — Z23 Encounter for immunization: Secondary | ICD-10-CM | POA: Diagnosis not present

## 2017-11-10 ENCOUNTER — Other Ambulatory Visit: Payer: Self-pay | Admitting: Internal Medicine

## 2017-11-10 DIAGNOSIS — Z1231 Encounter for screening mammogram for malignant neoplasm of breast: Secondary | ICD-10-CM

## 2017-12-15 ENCOUNTER — Ambulatory Visit
Admission: RE | Admit: 2017-12-15 | Discharge: 2017-12-15 | Disposition: A | Discharge status: Home / Self Care | Payer: Medicare HMO | Source: Ambulatory Visit | Attending: Internal Medicine | Admitting: Internal Medicine

## 2017-12-15 DIAGNOSIS — Z1231 Encounter for screening mammogram for malignant neoplasm of breast: Secondary | ICD-10-CM | POA: Diagnosis not present

## 2017-12-17 DIAGNOSIS — Z6836 Body mass index (BMI) 36.0-36.9, adult: Secondary | ICD-10-CM | POA: Diagnosis not present

## 2017-12-17 DIAGNOSIS — Z0000 Encounter for general adult medical examination without abnormal findings: Secondary | ICD-10-CM | POA: Diagnosis not present

## 2017-12-17 DIAGNOSIS — R5383 Other fatigue: Secondary | ICD-10-CM | POA: Diagnosis not present

## 2017-12-17 DIAGNOSIS — E785 Hyperlipidemia, unspecified: Secondary | ICD-10-CM | POA: Diagnosis not present

## 2017-12-31 DIAGNOSIS — Z818 Family history of other mental and behavioral disorders: Secondary | ICD-10-CM | POA: Diagnosis not present

## 2017-12-31 DIAGNOSIS — Z809 Family history of malignant neoplasm, unspecified: Secondary | ICD-10-CM | POA: Diagnosis not present

## 2017-12-31 DIAGNOSIS — E669 Obesity, unspecified: Secondary | ICD-10-CM | POA: Diagnosis not present

## 2017-12-31 DIAGNOSIS — Z803 Family history of malignant neoplasm of breast: Secondary | ICD-10-CM | POA: Diagnosis not present

## 2017-12-31 DIAGNOSIS — R69 Illness, unspecified: Secondary | ICD-10-CM | POA: Diagnosis not present

## 2017-12-31 DIAGNOSIS — E785 Hyperlipidemia, unspecified: Secondary | ICD-10-CM | POA: Diagnosis not present

## 2017-12-31 DIAGNOSIS — G4730 Sleep apnea, unspecified: Secondary | ICD-10-CM | POA: Diagnosis not present

## 2017-12-31 DIAGNOSIS — R42 Dizziness and giddiness: Secondary | ICD-10-CM | POA: Diagnosis not present

## 2017-12-31 DIAGNOSIS — J309 Allergic rhinitis, unspecified: Secondary | ICD-10-CM | POA: Diagnosis not present

## 2017-12-31 DIAGNOSIS — R030 Elevated blood-pressure reading, without diagnosis of hypertension: Secondary | ICD-10-CM | POA: Diagnosis not present

## 2018-03-14 HISTORY — PX: TRANSTHORACIC ECHOCARDIOGRAM: SHX275

## 2018-03-20 HISTORY — PX: OTHER SURGICAL HISTORY: SHX169

## 2018-05-07 DIAGNOSIS — H524 Presbyopia: Secondary | ICD-10-CM | POA: Diagnosis not present

## 2018-05-07 DIAGNOSIS — H33302 Unspecified retinal break, left eye: Secondary | ICD-10-CM | POA: Diagnosis not present

## 2018-05-07 DIAGNOSIS — H43392 Other vitreous opacities, left eye: Secondary | ICD-10-CM | POA: Diagnosis not present

## 2018-05-07 DIAGNOSIS — H2513 Age-related nuclear cataract, bilateral: Secondary | ICD-10-CM | POA: Diagnosis not present

## 2018-05-07 DIAGNOSIS — H33322 Round hole, left eye: Secondary | ICD-10-CM | POA: Diagnosis not present

## 2018-05-07 DIAGNOSIS — H43813 Vitreous degeneration, bilateral: Secondary | ICD-10-CM | POA: Diagnosis not present

## 2018-05-07 DIAGNOSIS — H43812 Vitreous degeneration, left eye: Secondary | ICD-10-CM | POA: Diagnosis not present

## 2018-05-07 DIAGNOSIS — D3121 Benign neoplasm of right retina: Secondary | ICD-10-CM | POA: Diagnosis not present

## 2018-05-11 DIAGNOSIS — R69 Illness, unspecified: Secondary | ICD-10-CM | POA: Diagnosis not present

## 2018-06-04 ENCOUNTER — Other Ambulatory Visit: Payer: Self-pay | Admitting: Physician Assistant

## 2018-06-04 ENCOUNTER — Ambulatory Visit
Admission: RE | Admit: 2018-06-04 | Discharge: 2018-06-04 | Disposition: A | Discharge status: Home / Self Care | Payer: Medicare HMO | Source: Ambulatory Visit | Attending: Physician Assistant | Admitting: Physician Assistant

## 2018-06-04 DIAGNOSIS — R0781 Pleurodynia: Secondary | ICD-10-CM

## 2018-06-04 DIAGNOSIS — R079 Chest pain, unspecified: Secondary | ICD-10-CM | POA: Diagnosis not present

## 2018-06-16 DIAGNOSIS — D3121 Benign neoplasm of right retina: Secondary | ICD-10-CM | POA: Diagnosis not present

## 2018-06-16 DIAGNOSIS — H33322 Round hole, left eye: Secondary | ICD-10-CM | POA: Diagnosis not present

## 2018-08-04 DIAGNOSIS — R8290 Unspecified abnormal findings in urine: Secondary | ICD-10-CM | POA: Diagnosis not present

## 2018-08-04 DIAGNOSIS — Z01419 Encounter for gynecological examination (general) (routine) without abnormal findings: Secondary | ICD-10-CM | POA: Diagnosis not present

## 2018-08-11 DIAGNOSIS — H31092 Other chorioretinal scars, left eye: Secondary | ICD-10-CM | POA: Diagnosis not present

## 2018-08-11 DIAGNOSIS — H43813 Vitreous degeneration, bilateral: Secondary | ICD-10-CM | POA: Diagnosis not present

## 2018-08-11 DIAGNOSIS — D3121 Benign neoplasm of right retina: Secondary | ICD-10-CM | POA: Diagnosis not present

## 2018-09-14 ENCOUNTER — Other Ambulatory Visit: Payer: Self-pay

## 2018-09-14 ENCOUNTER — Ambulatory Visit (INDEPENDENT_AMBULATORY_CARE_PROVIDER_SITE_OTHER): Payer: Medicare HMO

## 2018-09-14 ENCOUNTER — Ambulatory Visit (HOSPITAL_COMMUNITY)
Admission: EM | Admit: 2018-09-14 | Discharge: 2018-09-14 | Disposition: A | Discharge status: Home / Self Care | Payer: Medicare HMO | Attending: Family Medicine | Admitting: Family Medicine

## 2018-09-14 ENCOUNTER — Encounter (HOSPITAL_COMMUNITY): Payer: Self-pay | Admitting: Emergency Medicine

## 2018-09-14 DIAGNOSIS — R05 Cough: Secondary | ICD-10-CM | POA: Diagnosis not present

## 2018-09-14 DIAGNOSIS — K219 Gastro-esophageal reflux disease without esophagitis: Secondary | ICD-10-CM | POA: Diagnosis not present

## 2018-09-14 DIAGNOSIS — R6889 Other general symptoms and signs: Secondary | ICD-10-CM | POA: Diagnosis not present

## 2018-09-14 DIAGNOSIS — J984 Other disorders of lung: Secondary | ICD-10-CM | POA: Insufficient documentation

## 2018-09-14 DIAGNOSIS — Z20828 Contact with and (suspected) exposure to other viral communicable diseases: Secondary | ICD-10-CM | POA: Insufficient documentation

## 2018-09-14 DIAGNOSIS — R0602 Shortness of breath: Secondary | ICD-10-CM

## 2018-09-14 DIAGNOSIS — J40 Bronchitis, not specified as acute or chronic: Secondary | ICD-10-CM | POA: Diagnosis not present

## 2018-09-14 DIAGNOSIS — R5383 Other fatigue: Secondary | ICD-10-CM | POA: Diagnosis not present

## 2018-09-14 DIAGNOSIS — Z79899 Other long term (current) drug therapy: Secondary | ICD-10-CM | POA: Diagnosis not present

## 2018-09-14 DIAGNOSIS — Z20822 Contact with and (suspected) exposure to covid-19: Secondary | ICD-10-CM

## 2018-09-14 DIAGNOSIS — G4730 Sleep apnea, unspecified: Secondary | ICD-10-CM | POA: Insufficient documentation

## 2018-09-14 LAB — BASIC METABOLIC PANEL
Anion gap: 9 (ref 5–15)
BUN: 13 mg/dL (ref 8–23)
CO2: 27 mmol/L (ref 22–32)
Calcium: 9.2 mg/dL (ref 8.9–10.3)
Chloride: 104 mmol/L (ref 98–111)
Creatinine, Ser: 0.85 mg/dL (ref 0.44–1.00)
GFR calc Af Amer: >60 mL/min (ref >60)
GFR calc non Af Amer: >60 mL/min (ref >60)
Glucose, Bld: 96 mg/dL (ref 70–99)
Potassium: 3.7 mmol/L (ref 3.5–5.1)
Sodium: 140 mmol/L (ref 135–145)

## 2018-09-14 LAB — CBC
HCT: 41.4 % (ref 36.0–46.0)
Hemoglobin: 13.8 g/dL (ref 12.0–15.0)
MCH: 28.9 pg (ref 26.0–34.0)
MCHC: 33.3 g/dL (ref 30.0–36.0)
MCV: 86.6 fL (ref 80.0–100.0)
Platelets: 281 10*3/uL (ref 150–400)
RBC: 4.78 MIL/uL (ref 3.87–5.11)
RDW: 12.9 % (ref 11.5–15.5)
WBC: 9.3 10*3/uL (ref 4.0–10.5)
nRBC: 0 % (ref 0.0–0.2)

## 2018-09-14 NOTE — ED Provider Notes (Signed)
[facility]    CSN: [id number] Arrival date & time: 09/14/18  1507     History   Chief Complaint Chief Complaint  Patient presents with  . Shortness of Breath    HPI Isabel Fleming is a 73 y.o. female.   Patient presents with 1 year history of shortness of breath and fatigue, worse x2 days.  She denies dizziness, focal weakness, fever, chills, chest pain, heart palpitations, cough, abdominal pain, dysuria, back pain, or other symptoms.  She called her PCP and was instructed to come here for evaluation.    The history is provided by the patient.    Past Medical History:  Diagnosis Date  . Complication of anesthesia    "went beserk after 1st teflon removal surgery"  . GERD (gastroesophageal reflux disease)   . 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 Sleep apnea    cpap setting of 3, pt uses some nights  . Wears dentures    top    Patient Active Problem List   Diagnosis Date Noted  . Impingement syndrome of right shoulder 05/12/2013    Past Surgical History:  Procedure Laterality Date  . ABDOMINAL HYSTERECTOMY  yrs ago  . COLONOSCOPY WITH PROPOFOL  01/06/2012   Procedure: COLONOSCOPY WITH PROPOFOL;  Surgeon: Garlan Fair, MD;  Location: [facility];  Service: Endoscopy;  Laterality: N/A;  . DILATION AND CURETTAGE OF UTERUS    . KNEE ARTHROSCOPY WITH MEDIAL MENISECTOMY Left 07/15/2012   Procedure: LEFT KNEE ARTHROSCOPY WITH MEDIAL/LATERAL MENISECTOMY, PATELLA CHONDROPLASTY;  Surgeon: Ninetta Lights, MD;  Location: [facility];  Service: Orthopedics;  Laterality: Left;  . PATENT DUCTUS ARTERIOUS REPAIR  45 yrs ago-1969  . poly removed from vocal cord  4 1/2 yrs ago  . SHOULDER ARTHROSCOPY WITH SUBACROMIAL DECOMPRESSION, ROTATOR CUFF REPAIR AND BICEP TENDON REPAIR Right 05/12/2013   Procedure: RIGHT SHOULDER ARTHROSCOPY WITH DEBRIDEMENT ROTATOR  CUFF, RELEASE BICEPS, DISTAL CLAVICLE RESECTION, SUBACROMIAL DECOMPRESSION;  Surgeon: Ninetta Lights, MD;  Location: [facility];  Service: Orthopedics;  Laterality: Right;  . surgery for paralyzed vocal cord     x 2, teflon injections done, some teflon removed 5 yrs ago x 2    OB History   No obstetric history on file.      Home Medications    Prior to Admission medications   Medication Sig Start Date End Date Taking? Authorizing Provider  atorvastatin (LIPITOR) 10 MG tablet Take 10 mg by mouth daily.   Yes [provider]  fish oil-omega-3 fatty acids 1000 MG capsule Take 1 g by mouth daily.   Yes [provider]  Multiple Vitamin (MULTIVITAMIN WITH MINERALS) TABS Take 1 tablet by mouth daily.   Yes [provider]  vitamin B-12 (CYANOCOBALAMIN) 1000 MCG tablet Take 1,000 mcg by mouth daily.   Yes [provider]  bisacodyl (DULCOLAX) 5 MG EC tablet Take 1 tablet (5 mg total) by mouth daily as needed for moderate constipation. 05/12/13   Aundra Dubin, PA-C  esomeprazole (NEXIUM) 20 MG capsule Take 20 mg by mouth daily at 12 noon.    [provider]  glucosamine-chondroitin 500-400 MG tablet Take 1 tablet by mouth daily.    [provider]  ibuprofen (ADVIL,MOTRIN) 200 MG tablet Take 200 mg by mouth every 6 (six) hours as needed. For  pain    [provider]  lansoprazole (PREVACID) 15 MG capsule Take 15 mg by mouth daily as needed. For heartburn    [provider]  ondansetron (ZOFRAN) 4 MG tablet Take 1 tablet (4 mg total) by mouth every 8 (eight) hours as needed for nausea or vomiting. 05/12/13   Aundra Dubin, PA-C  sodium chloride (OCEAN) 0.65 % nasal spray Place 1 spray into the nose daily as needed. For nasal decongestant    [provider]    Family History Family History  Problem Relation Age of Onset  . Breast cancer Mother     Social History Social History   Tobacco Use   . Smoking status: Never Smoker  . Smokeless tobacco: Never Used  Substance Use Topics  . Alcohol use: No  . Drug use: No     Allergies   Patient has no known allergies.   Review of Systems Review of Systems  Constitutional: Positive for fatigue. Negative for chills and fever.  HENT: Negative for congestion, ear pain, rhinorrhea and sore throat.   Eyes: Negative for pain and visual disturbance.  Respiratory: Positive for shortness of breath. Negative for cough.   Cardiovascular: Negative for chest pain and palpitations.  Gastrointestinal: Negative for abdominal pain, diarrhea and vomiting.  Genitourinary: Negative for dysuria and hematuria.  Musculoskeletal: Negative for arthralgias and back pain.  Skin: Negative for color change and rash.  Neurological: Negative for seizures and syncope.  All other systems reviewed and are negative.    Physical Exam Triage Vital Signs ED Triage Vitals  Enc Vitals Group     BP 09/14/18 1549 (!) 143/83     Pulse Rate 09/14/18 1549 88     Resp 09/14/18 1549 (!) 22     Temp 09/14/18 1549 98.5 F (36.9 C)     Temp Source 09/14/18 1549 Oral     SpO2 09/14/18 1549 95 %     Weight --      Height --      Head Circumference --      Peak Flow --      Pain Score 09/14/18 1544 0     Pain Loc --      Pain Edu? --      Excl. in [place]? --    No data found.  Updated Vital Signs BP (!) 143/83 (BP Location: Right Arm) Comment (BP Location): large cuff  Pulse 88   Temp 98.5 F (36.9 C) (Oral)   Resp (!) 22   SpO2 95%   Visual Acuity Right Eye Distance:   Left Eye Distance:   Bilateral Distance:    Right Eye Near:   Left Eye Near:    Bilateral Near:     Physical Exam Vitals signs and nursing note reviewed.  Constitutional:      General: She is not in acute distress.    Appearance: She is well-developed.  HENT:     Head: Normocephalic and atraumatic.     Right Ear: Tympanic membrane normal.     Left Ear: Tympanic membrane normal.      Nose: Nose normal.     Mouth/Throat:     Mouth: Mucous membranes are moist.     Pharynx: Oropharynx is clear.  Eyes:     Conjunctiva/sclera: Conjunctivae normal.  Neck:     Musculoskeletal: Neck supple.  Cardiovascular:     Rate and Rhythm: Normal rate and regular rhythm.     Heart sounds: Normal heart sounds.  Pulmonary:     Effort: Pulmonary effort is normal. No respiratory distress.     Breath sounds: Normal breath sounds.  Abdominal:     General: Bowel sounds are normal.     Palpations: Abdomen is soft.     Tenderness: There is no abdominal tenderness. There is no guarding or rebound.  Musculoskeletal:     Comments: 1+ ankle edema L>R  Skin:    General: Skin is warm and dry.  Neurological:     General: No focal deficit present.     Mental Status: She is alert and oriented to person, place, and time.     Sensory: No sensory deficit.     Motor: No weakness.     Coordination: Coordination normal.     Gait: Gait normal.      UC Treatments / Results  Labs (all labs ordered are listed, but only abnormal results are displayed) Labs Reviewed  NOVEL CORONAVIRUS, NAA (HOSPITAL ORDER, SEND-OUT TO REF LAB)  CBC  BASIC METABOLIC PANEL    EKG   Radiology Dg Chest 2 View  Result Date: 09/14/2018 CLINICAL DATA:  Per pt: SOB for months, has got worse over the past two day, no cough, no fever. covid tested here, no testing prior to today. History of pneumonia and Bronchitis. History of cardiac surgery patent ductus. Non smoker. No HBP. Patient is not a diabetic EXAM: CHEST - 2 VIEW COMPARISON:  06/04/2018 and 03/28/2016. FINDINGS: Cardiac silhouette is normal in size. Aorta is prominent with atherosclerotic calcification, stable. No mediastinal or hilar masses or evidence of adenopathy. Linear nodular scarring, lateral right upper lobe. Additional mild linear opacities in the lower lungs also consistent with scarring. These findings are stable. No evidence of pneumonia or  pulmonary edema. No pleural effusion or pneumothorax. Skeletal structures are intact. IMPRESSION: 1. No acute cardiopulmonary disease. Stable appearance from the exam dated 03/28/2016. Electronically Signed   By: Lajean Manes M.D.   On: 09/14/2018 16:50    Procedures Procedures (including critical care time)  Medications Ordered in UC Medications - No data to display  Initial Impression / Assessment and Plan / UC Course  I have reviewed the triage vital signs and the nursing notes.  Pertinent labs & imaging results that were available during my care of the patient were reviewed by me and considered in my medical decision making (see chart for details).   Shortness of breath, fatigue, suspect COVID.  Chest x-ray, CBC, BMP unremarkable.  Patient is well-appearing and exam is unremarkable.  COVID test performed here and pending.  Patient instructed to self quarantine until test result is back and is negative.  Instructed patient to go to the emergency department if her shortness of breath worsens; or if she develops other symptoms such as dizziness, chest pain, palpitations, nausea, diaphoresis, weakness or other symptoms.     Final Clinical Impressions(s) / UC Diagnoses   Final diagnoses:  SOB (shortness of breath)  Fatigue, unspecified type  Suspected Covid-19 Virus Infection     Discharge Instructions     Your x-ray was normal today.  Your lab work that has come back is normal; test is still pending and we will call you if it is abnormal requiring treatment.  Follow-up with your primary care provider to discuss your test results and your symptoms.    Go to the emergency department if your shortness of breath gets worse; or if you develop dizziness, chest pain, heart palpitations, nausea, unusual sweating, weakness, or other symptoms.  Your COVID test is pending.  You should self quarantine until your test result is back and is negative.          ED Prescriptions    None      Controlled Substance Prescriptions [place] Controlled Substance Registry consulted? Not Applicable   Sharion Balloon, NP 09/14/18 1715

## 2018-09-14 NOTE — ED Triage Notes (Signed)
Sob and fatigued for one year.  Patient reports sob was particularly worse yesterday Denies fever Denies cough

## 2018-09-14 NOTE — Discharge Instructions (Addendum)
Your x-ray was normal today.  Your lab work that has come back is normal; test is still pending and we will call you if it is abnormal requiring treatment.  Follow-up with your primary care provider to discuss your test results and your symptoms.    Go to the emergency department if your shortness of breath gets worse; or if you develop dizziness, chest pain, heart palpitations, nausea, unusual sweating, weakness, or other symptoms.   Your COVID test is pending.  You should self quarantine until your test result is back and is negative.

## 2018-09-16 LAB — NOVEL CORONAVIRUS, NAA (HOSP ORDER, SEND-OUT TO REF LAB; TAT 18-24 HRS): SARS-CoV-2, NAA: NOT DETECTED

## 2018-10-04 DIAGNOSIS — R69 Illness, unspecified: Secondary | ICD-10-CM | POA: Diagnosis not present

## 2018-10-05 DIAGNOSIS — R69 Illness, unspecified: Secondary | ICD-10-CM | POA: Diagnosis not present

## 2018-12-13 DIAGNOSIS — R69 Illness, unspecified: Secondary | ICD-10-CM | POA: Diagnosis not present

## 2018-12-15 ENCOUNTER — Other Ambulatory Visit: Payer: Self-pay | Admitting: Physician Assistant

## 2018-12-15 DIAGNOSIS — Z1231 Encounter for screening mammogram for malignant neoplasm of breast: Secondary | ICD-10-CM

## 2018-12-23 DIAGNOSIS — Z0000 Encounter for general adult medical examination without abnormal findings: Secondary | ICD-10-CM | POA: Diagnosis not present

## 2018-12-23 DIAGNOSIS — M545 Low back pain: Secondary | ICD-10-CM | POA: Diagnosis not present

## 2018-12-23 DIAGNOSIS — I700 Atherosclerosis of aorta: Secondary | ICD-10-CM | POA: Diagnosis not present

## 2018-12-23 DIAGNOSIS — Z23 Encounter for immunization: Secondary | ICD-10-CM | POA: Diagnosis not present

## 2018-12-23 DIAGNOSIS — R69 Illness, unspecified: Secondary | ICD-10-CM | POA: Diagnosis not present

## 2018-12-23 DIAGNOSIS — K146 Glossodynia: Secondary | ICD-10-CM | POA: Diagnosis not present

## 2018-12-23 DIAGNOSIS — E785 Hyperlipidemia, unspecified: Secondary | ICD-10-CM | POA: Diagnosis not present

## 2018-12-23 DIAGNOSIS — Z1389 Encounter for screening for other disorder: Secondary | ICD-10-CM | POA: Diagnosis not present

## 2018-12-23 DIAGNOSIS — Z6837 Body mass index (BMI) 37.0-37.9, adult: Secondary | ICD-10-CM | POA: Diagnosis not present

## 2019-01-24 DIAGNOSIS — M25551 Pain in right hip: Secondary | ICD-10-CM | POA: Diagnosis not present

## 2019-02-02 ENCOUNTER — Other Ambulatory Visit: Payer: Self-pay

## 2019-02-02 ENCOUNTER — Ambulatory Visit
Admission: RE | Admit: 2019-02-02 | Discharge: 2019-02-02 | Disposition: A | Discharge status: Home / Self Care | Payer: Medicare HMO | Source: Ambulatory Visit | Attending: Physician Assistant | Admitting: Physician Assistant

## 2019-02-02 DIAGNOSIS — Z1231 Encounter for screening mammogram for malignant neoplasm of breast: Secondary | ICD-10-CM | POA: Diagnosis not present

## 2019-02-21 ENCOUNTER — Ambulatory Visit: Payer: Medicare HMO | Admitting: Cardiology

## 2019-02-21 ENCOUNTER — Other Ambulatory Visit: Payer: Self-pay

## 2019-02-21 ENCOUNTER — Encounter: Payer: Self-pay | Admitting: Cardiology

## 2019-02-21 VITALS — BP 114/79 | HR 94 | Temp 97.2°F | Ht 65.0 in | Wt 228.0 lb

## 2019-02-21 DIAGNOSIS — R0609 Other forms of dyspnea: Secondary | ICD-10-CM | POA: Insufficient documentation

## 2019-02-21 DIAGNOSIS — R0600 Dyspnea, unspecified: Secondary | ICD-10-CM | POA: Diagnosis not present

## 2019-02-21 DIAGNOSIS — E785 Hyperlipidemia, unspecified: Secondary | ICD-10-CM

## 2019-02-21 DIAGNOSIS — R0602 Shortness of breath: Secondary | ICD-10-CM

## 2019-02-21 DIAGNOSIS — Q250 Patent ductus arteriosus: Secondary | ICD-10-CM

## 2019-02-21 DIAGNOSIS — I700 Atherosclerosis of aorta: Secondary | ICD-10-CM | POA: Diagnosis not present

## 2019-02-21 DIAGNOSIS — I208 Other forms of angina pectoris: Secondary | ICD-10-CM

## 2019-02-21 DIAGNOSIS — I2089 Other forms of angina pectoris: Secondary | ICD-10-CM | POA: Insufficient documentation

## 2019-02-21 HISTORY — DX: Atherosclerosis of aorta: I70.0

## 2019-02-21 MED ORDER — METOPROLOL TARTRATE 50 MG PO TABS: 100.0000 mg | ORAL_TABLET | Freq: Once | ORAL | 0 refills | Status: AC

## 2019-02-21 NOTE — Progress Notes (Signed)
Primary Care Provider: Maude Leriche, PA-C Cardiologist: No primary care provider on file. Electrophysiologist:   Clinic Note: Chief Complaint  Patient presents with  . New Patient (Initial Visit)  . Shortness of Breath    On exertion  . Chest Pain    HPI:    Isabel Fleming is a 74 y.o. female with PMH notable for hyperlipidemia and obesity with OSA-CPAP who is being seen today for the evaluation of Shortness of Breath on Exertion and Aortic Atherosclerosis (as noted on CT scan chest x-ray and reevaluate in a.m. we will resume) at the request of Scifres, Dorothy, PA-C.  [name] was last seen on December 23, 2018 by her PCP -> noted having some exertional dyspnea and chest heaviness with activity over the last few months. --> Had a recent left knee meniscal tear.  Was no longer as active as usual.  Try to stay active and maintaining the yard, not exercising regularly  Recent Hospitalizations: ER visit in July for shortness of breath and fatigue.  Ruled out for Covid.  Reviewed  CV studies:    The following studies were reviewed today: (if available, images/films reviewed: From Epic Chart or Care Everywhere) . No recent studies:   Interval History:   Isabel Fleming presents here today for cardiology evaluation mostly noting that she has exertional dyspnea but not with routinely walking around the house, it is usually when she is going up an incline or carrying something.  If she goes any further than that she may feel little tightness or difficulty breathing but is really because is hard to take a deep breath in.  Sometimes when she gets short of breath like this should get dizzy and woozy but also can get dizzy if she is in a crowded environment.  No syncope or near syncope.  No heart failure symptoms of PND, orthopnea or edema.  CV Review of Symptoms (Summary): positive for - chest pain, dyspnea on exertion, edema and irregular heartbeat negative for - orthopnea,  paroxysmal nocturnal dyspnea, rapid heart rate, shortness of breath or Syncope/near syncope, TIA/amaurosis fugax, claudication  The patient does not have symptoms concerning for COVID-19 infection (fever, chills, cough, or new shortness of breath).  The patient is practicing social distancing. +++ Masking.  Rarely goes out for groceries/shopping.    REVIEWED OF SYSTEMS   A comprehensive ROS was performed. Review of Systems  Constitutional: Positive for malaise/fatigue (Decreased exercise tolerance).  HENT: Negative for congestion and nosebleeds.   Respiratory: Negative for shortness of breath (Only with exertion) and wheezing.        She sleeps somewhat elevated mostly because of comfort and GERD.  Cardiovascular: Positive for chest pain (Per HPI).  Gastrointestinal: Negative for abdominal pain, blood in stool, constipation, heartburn and melena.  Genitourinary: Negative for hematuria.  Musculoskeletal: Positive for joint pain (Still has some swelling in her left knee from her meniscal tear, also has swelling in the ankle.). Negative for myalgias.  Skin: Negative for rash.  Neurological: Positive for dizziness.  Psychiatric/Behavioral: The patient has insomnia (Maybe 1 day out of the week she will have difficulties with them.).   All other systems reviewed and are negative.  I have reviewed and (if needed) personally updated the patient's problem list, medications, allergies, past medical and surgical history, social and family history.   PAST MEDICAL HISTORY   Past Medical History:  Diagnosis Date  . Aortic atherosclerosis ([place]) 02/21/2019  . Complication of anesthesia    "  went beserk after 1st teflon removal surgery"  . Depression    Not taking medication  . Dyslipidemia, goal LDL below 100   . GERD (gastroesophageal reflux disease)    Takes intermittent PPI  . History of vertigo   . Moderate obesity    BMI 38  . OSA on CPAP    cpap setting of 3, pt uses some nights  .  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 Wears dentures    top    PAST SURGICAL HISTORY   Past Surgical History:  Procedure Laterality Date  . ABDOMINAL HYSTERECTOMY  yrs ago  . COLONOSCOPY WITH PROPOFOL  01/06/2012   Procedure: COLONOSCOPY WITH PROPOFOL;  Surgeon: Garlan Fair, MD;  Location: [facility];  Service: Endoscopy;  Laterality: N/A;  . DILATION AND CURETTAGE OF UTERUS    . KNEE ARTHROSCOPY WITH MEDIAL MENISECTOMY Left 07/15/2012   Procedure: LEFT KNEE ARTHROSCOPY WITH MEDIAL/LATERAL MENISECTOMY, PATELLA CHONDROPLASTY;  Surgeon: Ninetta Lights, MD;  Location: [facility];  Service: Orthopedics;  Laterality: Left;  . PATENT DUCTUS ARTERIOUS REPAIR  45 yrs ago-1969  . poly removed from vocal cord  4 1/2 yrs ago  . SHOULDER ARTHROSCOPY WITH SUBACROMIAL DECOMPRESSION, ROTATOR CUFF REPAIR AND BICEP TENDON REPAIR Right 05/12/2013   Procedure: RIGHT SHOULDER ARTHROSCOPY WITH DEBRIDEMENT ROTATOR CUFF, RELEASE BICEPS, DISTAL CLAVICLE RESECTION, SUBACROMIAL DECOMPRESSION;  Surgeon: Ninetta Lights, MD;  Location: [facility];  Service: Orthopedics;  Laterality: Right;  . surgery for paralyzed vocal cord     x 2, teflon injections done, some teflon removed 5 yrs ago x 2    MEDICATIONS/ALLERGIES   Current Meds  Medication Sig  . atorvastatin (LIPITOR) 10 MG tablet Take 10 mg by mouth daily.  . bisacodyl (DULCOLAX) 5 MG EC tablet Take 1 tablet (5 mg total) by mouth daily as needed for moderate constipation.  Marland Kitchen ibuprofen (ADVIL,MOTRIN) 200 MG tablet Take 200 mg by mouth every 6 (six) hours as needed. For pain  . Multiple Vitamin (MULTIVITAMIN WITH MINERALS) TABS Take 1 tablet by mouth daily.  . ondansetron (ZOFRAN) 4 MG tablet Take 1 tablet (4 mg total) by mouth every 8 (eight) hours as needed for nausea or vomiting.  . sodium chloride (OCEAN) 0.65 % nasal  spray Place 1 spray into the nose daily as needed. For nasal decongestant  . vitamin B-12 (CYANOCOBALAMIN) 1000 MCG tablet Take 1,000 mcg by mouth daily.  . [DISCONTINUED] esomeprazole (NEXIUM) 20 MG capsule Take 20 mg by mouth daily at 12 noon.  . [DISCONTINUED] fish oil-omega-3 fatty acids 1000 MG capsule Take 1 g by mouth daily.  . [DISCONTINUED] glucosamine-chondroitin 500-400 MG tablet Take 1 tablet by mouth daily.  . [DISCONTINUED] lansoprazole (PREVACID) 15 MG capsule Take 15 mg by mouth daily as needed. For heartburn   Only takes atorvastatin intermittently  No Known Allergies   SOCIAL HISTORY/FAMILY HISTORY   Social History   Tobacco Use  . Smoking status: Never Smoker  . Smokeless tobacco: Never Used  Substance Use Topics  . Alcohol use: No  . Drug use: No   Social History   Social History Narrative   Married mother of 2, grandmother of 1.   Never smoked.  Does not drink.   Diet: Tries to have low globulin, low sugar.      Retired in April 2019  from [employer] (formerly [employer])      Does not do much walking currently because of left meniscal tear.  Pre-Covid used to try to water aerobics.    Family History family history includes Bipolar disorder in her sister; Breast cancer in her mother; CAD in her father; Heart attack in her father; Lung cancer in her father; Uterine cancer in her mother.   OBJCTIVE -PE, EKG, labs   Wt Readings from Last 3 Encounters:  02/21/19 228 lb (103.4 kg)  05/12/13 211 lb 6 oz (95.9 kg)  07/15/12 215 lb 6 oz (97.7 kg)    Physical Exam: BP 114/79   Pulse 94   Temp (!) 97.2 F (36.2 C)   Ht 5\' 5"  (1.651 m)   Wt 228 lb (103.4 kg)   SpO2 95%   BMI 37.94 kg/m  Physical Exam  Constitutional: She is oriented to person, place, and time. She appears well-developed and well-nourished. No distress.  Borderline morbidly obese.  Well-groomed  HENT:  Head: Normocephalic and atraumatic.  Eyes: Pupils are equal, round, and reactive to  light. Conjunctivae and EOM are normal.  Neck: No hepatojugular reflux and no JVD present. Carotid bruit is not present.  Cardiovascular: Normal rate, regular rhythm, S1 normal, S2 normal and intact distal pulses.  No extrasystoles are present. PMI is not displaced (Difficult to palpate). Exam reveals gallop, S4 (Versus split S2) and distant heart sounds.  No murmur heard. Pulmonary/Chest: Effort normal. No respiratory distress. She has no wheezes. She has no rales.  Somewhat distant breath sounds  Abdominal: Soft. Bowel sounds are normal. She exhibits no distension. There is no abdominal tenderness. There is no rebound.  No HSM  Musculoskeletal:        General: Edema (Trivial left ankle) present. Normal range of motion.     Cervical back: Normal range of motion and neck supple.  Neurological: She is alert and oriented to person, place, and time. No cranial nerve deficit.  Skin: Skin is warm and dry.  Psychiatric: She has a normal mood and affect. Her behavior is normal. Judgment and thought content normal.  Vitals reviewed.    Adult ECG Report  Rate: 86 ;  Rhythm: normal sinus rhythm and Cannot exclude left atrial enlargement.  Otherwise normal axis, intervals and durations.;   Narrative Interpretation: Normal EKG  Recent Labs: December 24, 2018  Na+ 141, K+ 4.3, Cl- 102, HCO3-32, BUN 11, Cr 0.82, Glu 88, Ca2+ 9.3; AST 19, ALT 14, AlkP 94  CBC: W 7.9, H/H 14.2/42.1, Plt 273  TC 170, TG 122, HDL 58, LDL 87  No results found for: CHOL, HDL, LDLCALC, LDLDIRECT, TRIG, CHOLHDL Lab Results  Component Value Date   CREATININE 0.85 09/14/2018   BUN 13 09/14/2018   NA 140 09/14/2018   K 3.7 09/14/2018   CL 104 09/14/2018   CO2 27 09/14/2018    ASSESSMENT/PLAN    Problem List Items Addressed This Visit    Dyslipidemia, goal LDL below 100 (Chronic)    At present, lipids look pretty good.  We will adjust based on what the findings of coronary calcium score and coronary CTA  showed. Currently on only 10 of atorvastatin.  May need to adjust goal to less than 70, if so, would probably titrate further.      Patent ductus arteriosus-status post repair (Chronic)    Status post repair in early adulthood.  Has not had echocardiogram done a long time.  She has a very split S2 or  S4 gallop on exam.    Plan: Check 2D echo      DOE (dyspnea on exertion) - Primary    Exertional dyspnea seems to be worse than it had been.  This could be related to deconditioning since the Covid lockdown timeframe and her concommittant left knee injury and obesity.  Plan: Ischemic evaluation with coronary CTA, but also 2D echocardiogram to evaluate EF and filling pressures      Relevant Orders   EKG 12-Lead (Completed)   CT CORONARY MORPH W/CTA COR W/SCORE W/CA W/CM &/OR WO/CM   CT CORONARY FRACTIONAL FLOW RESERVE DATA PREP   CT CORONARY FRACTIONAL FLOW RESERVE FLUID ANALYSIS   Basic metabolic panel   Aortic atherosclerosis (HCC)    Evidence of aortic atherosclerosis noted none chest x-ray with previous CT scans.  Also probably a suggestion of some coronary artery atherosclerosis.  Plan: Based stratification with pulmonary calcium score, but will also check coronary CTA and possible FFR based on exertional dyspnea and chest comfort.  Continue blood pressure and lipid/glycemic control      Relevant Orders   EKG 12-Lead (Completed)   ECHOCARDIOGRAM COMPLETE   Atypical angina (HCC)    The chest tightness he feels with exertion is not a typical sounding angina and that it is not always with exertion, and always the same amount.  It seems like it is more just her having air hunger.  However this could still be an anginal equivalent.  She does have a family history of CAD and risk factors of hyperlipidemia and obesity.  Plan: Coronary Calcium Score with Coronary CTA and possible CT-FFR.      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   The importance of social distancing was discussed today.  I spent a total of 42minutes with the patient and chart review. >  50% of the time was spent in direct patient consultation.  Additional time spent with chart review (studies, outside notes, etc): 15 Total Time: 35 min   Current medicines are reviewed at length with the patient today.  (+/- concerns) n/a   Patient Instructions / Medication Changes & Studies & Tests Ordered   Patient Instructions  Medication Instructions:  SEE INSTRUCTIONS SHEET FOR  CCTA *If you need a refill on your cardiac medications before your next appointment, please call your pharmacy*  Lab Work: See instruction sheet for CORONARY CTA ( CCTA) If you have labs (blood work) drawn today and your  tests are completely normal, you will receive your results only by: Marland Kitchen MyChart Message (if you have MyChart) OR . A paper copy in the mail If you have any lab test that is abnormal or we need to change your treatment, we will call you to review the results.  Testing/Procedures:  WILL BE SCHEDULE AT [place] Your physician has requested that you have an echocardiogram. Echocardiography is a painless test that uses sound waves to create images of your heart. It provides your doctor with information about the size and shape of your heart and how well your heart's chambers and valves are working. This procedure takes approximately one hour. There are no restrictions for this procedure.   AND WILL BE SCHEDULE AT [place] DEPT Your physician has requested that you have cardiac CTA. Cardiac computed tomography (CT) is a painless test that uses an x-ray machine to take clear, detailed pictures of your heart. For further information please visit [facility]uge[facility]iest[facility]tn. Please follow instruction sheet as given.    Follow-Up: At [facility], you and your health needs are our priority.  As part of our continuing mission to provide you with exceptional heart care, we have created designated Provider Care Teams.  These Care Teams include your primary Cardiologist (physician) and Advanced Practice Providers (APPs -  Physician Assistants and Nurse Practitioners) who all work together to provide you with the care you need, when you need it.  Your next appointment:   2 month(s)  The format for your next appointment:   In Person     Provider:   Glenetta Hew, MD  Studies Ordered:   Orders Placed This Encounter  Procedures  . CT CORONARY MORPH W/CTA COR W/SCORE W/CA W/CM &/OR WO/CM  . CT CORONARY FRACTIONAL FLOW RESERVE DATA PREP  . CT CORONARY FRACTIONAL FLOW RESERVE FLUID ANALYSIS  . Basic metabolic panel  . EKG 12-Lead  . ECHOCARDIOGRAM COMPLETE     Glenetta Hew, M.D., M.S. Interventional Cardiologist   Pager # [phone] Phone # [phone] [address] [place], [place] [address]   Thank you for choosing Heartcare at [facility]!!

## 2019-02-21 NOTE — Patient Instructions (Addendum)
Medication Instructions:  SEE INSTRUCTIONS SHEET FOR  CCTA *If you need a refill on your cardiac medications before your next appointment, please call your pharmacy*  Lab Work: See instruction sheet for CORONARY CTA ( CCTA) If you have labs (blood work) drawn today and your tests are completely normal, you will receive your results only by: Marland Kitchen MyChart Message (if you have MyChart) OR . A paper copy in the mail If you have any lab test that is abnormal or we need to change your treatment, we will call you to review the results.  Testing/Procedures:  WILL BE SCHEDULE AT [place] Your physician has requested that you have an echocardiogram. Echocardiography is a painless test that uses sound waves to create images of your heart. It provides your doctor with information about the size and shape of your heart and how well your heart's chambers and valves are working. This procedure takes approximately one hour. There are no restrictions for this procedure.   AND WILL BE SCHEDULE AT [place] DEPT Your physician has requested that you have cardiac CTA. Cardiac computed tomography (CT) is a painless test that uses an x-ray machine to take clear, detailed pictures of your heart. For further information please visit [facility]iest[facility]tn. Please follow instruction sheet as given.    Follow-Up: At [facility], you and your health needs are our priority.  As part of our continuing mission to provide you with exceptional heart care, we have created designated Provider Care Teams.  These Care Teams include your primary Cardiologist (physician) and Advanced Practice Providers (APPs -  Physician Assistants and Nurse Practitioners) who all work together to provide you with the care you need, when you need it.  Your next appointment:   2 month(s)  The format for your next appointment:   In Person     Provider:   Glenetta Hew, MD    Your cardiac CT will  be scheduled at one of the below locations:   [facility] [address] [place], [place] [address] [phone]   If scheduled at [facility], please arrive at the [facility] main entrance of [facility] 30-45 minutes prior to test start time. Proceed to the [facility] Radiology Department (first floor) to check-in and test prep.    Please follow these instructions carefully (unless otherwise directed):   PLEASE  HAVE LABS - BMP ONE WEEK  -  BEFORE  TEST   On the Night Before the Test: . Be sure to Drink plenty of water. . Do not consume any caffeinated/decaffeinated beverages or chocolate 12 hours prior to your test. . Do not take any antihistamines 12 hours prior to your test.   On the Day of the Test: . Drink plenty of water. Do not drink any water within one hour of the test. . Do not eat any food 4 hours prior to the test. . You may take your regular medications prior to the test.  . Take metoprolol (Lopressor) 100 MG  two hours prior to test. . FEMALES- please wear underwire-free bra if available   After the Test: . Drink plenty of water. . After receiving IV contrast, you may experience a mild flushed feeling. This is normal. . On occasion, you may experience a mild rash up to 24 hours after the test. This is not dangerous. If this occurs, you can take Benadryl 25 mg and increase your fluid intake. . If you experience trouble  breathing, this can be serious. If it is severe call 911 IMMEDIATELY. If it is mild, please call our office.   Once we have confirmed authorization from your insurance company, we will call you to set up a date and time for your test.   For non-scheduling related questions, please contact the cardiac imaging nurse navigator should you have any questions/concerns: Marchia Bond, RN Navigator Cardiac Imaging [facility] [phone] Office

## 2019-02-22 ENCOUNTER — Encounter: Payer: Self-pay | Admitting: Cardiology

## 2019-02-22 DIAGNOSIS — L570 Actinic keratosis: Secondary | ICD-10-CM | POA: Diagnosis not present

## 2019-02-22 DIAGNOSIS — L738 Other specified follicular disorders: Secondary | ICD-10-CM | POA: Diagnosis not present

## 2019-02-22 DIAGNOSIS — L718 Other rosacea: Secondary | ICD-10-CM | POA: Diagnosis not present

## 2019-02-22 DIAGNOSIS — D485 Neoplasm of uncertain behavior of skin: Secondary | ICD-10-CM | POA: Diagnosis not present

## 2019-02-22 DIAGNOSIS — B353 Tinea pedis: Secondary | ICD-10-CM | POA: Diagnosis not present

## 2019-02-22 DIAGNOSIS — L820 Inflamed seborrheic keratosis: Secondary | ICD-10-CM | POA: Diagnosis not present

## 2019-02-22 DIAGNOSIS — L821 Other seborrheic keratosis: Secondary | ICD-10-CM | POA: Diagnosis not present

## 2019-02-22 DIAGNOSIS — L814 Other melanin hyperpigmentation: Secondary | ICD-10-CM | POA: Diagnosis not present

## 2019-02-22 DIAGNOSIS — D225 Melanocytic nevi of trunk: Secondary | ICD-10-CM | POA: Diagnosis not present

## 2019-02-22 DIAGNOSIS — C44612 Basal cell carcinoma of skin of right upper limb, including shoulder: Secondary | ICD-10-CM | POA: Diagnosis not present

## 2019-02-22 DIAGNOSIS — L905 Scar conditions and fibrosis of skin: Secondary | ICD-10-CM | POA: Diagnosis not present

## 2019-02-22 NOTE — Assessment & Plan Note (Signed)
Exertional dyspnea seems to be worse than it had been.  This could be related to deconditioning since the Covid lockdown timeframe and her concommittant left knee injury and obesity.  Plan: Ischemic evaluation with coronary CTA, but also 2D echocardiogram to evaluate EF and filling pressures

## 2019-02-22 NOTE — Assessment & Plan Note (Signed)
Evidence of aortic atherosclerosis noted none chest x-ray with previous CT scans.  Also probably a suggestion of some coronary artery atherosclerosis.  Plan: Based stratification with pulmonary calcium score, but will also check coronary CTA and possible FFR based on exertional dyspnea and chest comfort.  Continue blood pressure and lipid/glycemic control

## 2019-02-22 NOTE — Assessment & Plan Note (Signed)
The chest tightness he feels with exertion is not a typical sounding angina and that it is not always with exertion, and always the same amount.  It seems like it is more just her having air hunger.  However this could still be an anginal equivalent.  She does have a family history of CAD and risk factors of hyperlipidemia and obesity.  Plan: Coronary Calcium Score with Coronary CTA and possible CT-FFR.

## 2019-02-22 NOTE — Assessment & Plan Note (Signed)
Status post repair in early adulthood.  Has not had echocardiogram done a long time.  She has a very split S2 or S4 gallop on exam.    Plan: Check 2D echo

## 2019-02-22 NOTE — Assessment & Plan Note (Signed)
At present, lipids look pretty good.  We will adjust based on what the findings of coronary calcium score and coronary CTA showed. Currently on only 10 of atorvastatin.  May need to adjust goal to less than 70, if so, would probably titrate further.

## 2019-03-01 ENCOUNTER — Telehealth: Payer: Self-pay | Admitting: Cardiology

## 2019-03-01 NOTE — Telephone Encounter (Signed)
Patient called to reschedule echo due to covid exposure. Patient has questions regarding the medication before the echo.

## 2019-03-01 NOTE — Telephone Encounter (Signed)
Spoke to patient -- informed her she does not need the medication metoprolol for echo but for CCTA.  Patient is aware not   Instruction is needed for echo .  She states she was exposed to sat jan 9 ,2021.  RN  INFORMED PATIENT IF SHE DOES NOT HAVE ANY SX OR COMEDOWN WITH VIRUS SHE HAS 10 DAY  TO ISOLATE .  SHE AWARE SHE WOULD NEED TO RESCHEDULE IF SHE DID DEVELOP COVID.

## 2019-03-01 NOTE — Telephone Encounter (Signed)
Spoke to patient she wanted  RN to call her back in @5  min.

## 2019-03-03 ENCOUNTER — Other Ambulatory Visit (HOSPITAL_COMMUNITY): Payer: Medicare HMO

## 2019-03-10 DIAGNOSIS — C44612 Basal cell carcinoma of skin of right upper limb, including shoulder: Secondary | ICD-10-CM | POA: Diagnosis not present

## 2019-03-15 ENCOUNTER — Other Ambulatory Visit: Payer: Self-pay

## 2019-03-15 ENCOUNTER — Ambulatory Visit (HOSPITAL_COMMUNITY): Payer: Medicare HMO | Attending: Cardiovascular Disease

## 2019-03-15 DIAGNOSIS — I700 Atherosclerosis of aorta: Secondary | ICD-10-CM | POA: Insufficient documentation

## 2019-03-15 DIAGNOSIS — I208 Other forms of angina pectoris: Secondary | ICD-10-CM | POA: Diagnosis not present

## 2019-03-16 ENCOUNTER — Encounter: Payer: Self-pay | Admitting: Cardiology

## 2019-03-16 DIAGNOSIS — I712 Thoracic aortic aneurysm, without rupture, unspecified: Secondary | ICD-10-CM | POA: Insufficient documentation

## 2019-03-16 NOTE — Progress Notes (Signed)
Echocardiogram results: Ejection fraction 60 to 65% (normal range).  There is grade 1 diastolic dysfunction which is normal for age.  No wall motion normalities suggestive of prior heart attack.  There is mild aortic valve calcification/sclerosis (hardening) but no evidence of narrowing.  1 somewhat abnormal finding is at the a sending aorta appears to be dilated a little bit to 4.5 mm. -->  With a history of PDA repair, we should probably evaluate this with a cardiac CTA to get a more accurate assessment.  Depending on what it shows we can need to follow-up with intermittent echocardiogram versus CT scans.  Would recommend cardiac CTA (morph) to evaluate for thoracic aortic aneurysm.  Glenetta Hew, MD

## 2019-03-24 ENCOUNTER — Other Ambulatory Visit: Payer: Self-pay

## 2019-03-28 ENCOUNTER — Telehealth: Payer: Self-pay | Admitting: Cardiology

## 2019-03-28 NOTE — Telephone Encounter (Signed)
Patient calling for echo results 

## 2019-03-28 NOTE — Telephone Encounter (Signed)
Echocardiogram results: Ejection fraction 60 to 65% (normal range). There is grade 1 diastolic dysfunction which is normal for age. No wall motion normalities suggestive of prior heart attack. There is mild aortic valve calcification/sclerosis (hardening) but no evidence of narrowing.  1 somewhat abnormal finding is at the a sending aorta appears to be dilated a little bit to 4.5 mm. --> With a history of PDA repair, we should probably evaluate this with a cardiac CTA to get a more accurate assessment. Depending on what it shows we can need to follow-up with intermittent echocardiogram versus CT scans.  Would recommend cardiac CTA (morph) to evaluate for thoracic aortic aneurysm.  ____  Patient is already scheduled for coronary CT with FFR - asking if this CT will suffice for ascending aorta dilatation measurement.   Will route to MD/RN

## 2019-03-29 DIAGNOSIS — R0600 Dyspnea, unspecified: Secondary | ICD-10-CM | POA: Diagnosis not present

## 2019-03-29 DIAGNOSIS — I208 Other forms of angina pectoris: Secondary | ICD-10-CM | POA: Diagnosis not present

## 2019-03-30 ENCOUNTER — Other Ambulatory Visit: Payer: Self-pay | Admitting: Emergency Medicine

## 2019-03-30 ENCOUNTER — Telehealth (HOSPITAL_COMMUNITY): Payer: Self-pay | Admitting: Emergency Medicine

## 2019-03-30 DIAGNOSIS — I712 Thoracic aortic aneurysm, without rupture, unspecified: Secondary | ICD-10-CM

## 2019-03-30 LAB — BASIC METABOLIC PANEL
BUN/Creatinine Ratio: 12 (ref 12–28)
BUN: 10 mg/dL (ref 8–27)
CO2: 26 mmol/L (ref 20–29)
Calcium: 9.3 mg/dL (ref 8.7–10.3)
Chloride: 101 mmol/L (ref 96–106)
Creatinine, Ser: 0.81 mg/dL (ref 0.57–1.00)
GFR calc Af Amer: 83 mL/min/{1.73_m2} (ref >59)
GFR calc non Af Amer: 72 mL/min/{1.73_m2} (ref >59)
Glucose: 95 mg/dL (ref 65–99)
Potassium: 4.7 mmol/L (ref 3.5–5.2)
Sodium: 139 mmol/L (ref 134–144)

## 2019-03-30 NOTE — Telephone Encounter (Signed)
Reaching out to patient to offer assistance regarding upcoming cardiac imaging study; pt verbalizes understanding of appt date/time, parking situation and where to check in, pre-test NPO status and medications ordered, and verified current allergies; name and call back number provided for further questions should they arise Khaliq Turay RN Navigator Cardiac Imaging [facility] [phone] office [phone] cell 

## 2019-03-30 NOTE — Telephone Encounter (Signed)
So the plan was for her to get the coronary CTA, I talked with the nurse who does the CTs, and she was and the CT angio of the chest to look at the ascending aorta as well.  She was having symptoms concerning for chest pain, but we planned to try to do a combined study.  Glenetta Hew, MD

## 2019-03-31 ENCOUNTER — Other Ambulatory Visit: Payer: Self-pay

## 2019-03-31 ENCOUNTER — Encounter (HOSPITAL_COMMUNITY): Payer: Self-pay

## 2019-03-31 ENCOUNTER — Ambulatory Visit (HOSPITAL_COMMUNITY)
Admission: RE | Admit: 2019-03-31 | Discharge: 2019-03-31 | Disposition: A | Discharge status: Home / Self Care | Payer: Medicare HMO | Source: Ambulatory Visit | Attending: Cardiology | Admitting: Cardiology

## 2019-03-31 DIAGNOSIS — R0602 Shortness of breath: Secondary | ICD-10-CM | POA: Diagnosis present

## 2019-03-31 DIAGNOSIS — I712 Thoracic aortic aneurysm, without rupture, unspecified: Secondary | ICD-10-CM

## 2019-03-31 DIAGNOSIS — R0609 Other forms of dyspnea: Secondary | ICD-10-CM

## 2019-03-31 DIAGNOSIS — R0600 Dyspnea, unspecified: Secondary | ICD-10-CM | POA: Diagnosis present

## 2019-03-31 DIAGNOSIS — I208 Other forms of angina pectoris: Secondary | ICD-10-CM | POA: Diagnosis not present

## 2019-03-31 MED ORDER — NITROGLYCERIN 0.4 MG SL SUBL
SUBLINGUAL_TABLET | SUBLINGUAL | Status: AC
  Filled 2019-03-31: qty 2

## 2019-03-31 MED ORDER — IOHEXOL 350 MG/ML SOLN
80.0000 mL | Freq: Once | INTRAVENOUS | Status: AC | PRN
  Administered 2019-03-31: 80 mL via INTRAVENOUS

## 2019-03-31 MED ORDER — NITROGLYCERIN 0.4 MG SL SUBL
0.8000 mg | SUBLINGUAL_TABLET | Freq: Once | SUBLINGUAL | Status: AC
  Administered 2019-03-31: 0.8 mg via SUBLINGUAL

## 2019-03-31 MED ORDER — METOPROLOL TARTRATE 5 MG/5ML IV SOLN: 5.0000 mg | INTRAVENOUS | Status: DC | PRN

## 2019-03-31 MED ORDER — METOPROLOL TARTRATE 5 MG/5ML IV SOLN
INTRAVENOUS | Status: AC
  Filled 2019-03-31: qty 5

## 2019-04-21 DIAGNOSIS — L905 Scar conditions and fibrosis of skin: Secondary | ICD-10-CM | POA: Diagnosis not present

## 2019-04-21 DIAGNOSIS — B354 Tinea corporis: Secondary | ICD-10-CM | POA: Diagnosis not present

## 2019-04-21 DIAGNOSIS — Z85828 Personal history of other malignant neoplasm of skin: Secondary | ICD-10-CM | POA: Diagnosis not present

## 2019-04-28 ENCOUNTER — Other Ambulatory Visit: Payer: Self-pay

## 2019-04-28 ENCOUNTER — Encounter: Payer: Self-pay | Admitting: Cardiology

## 2019-04-28 ENCOUNTER — Ambulatory Visit: Payer: Medicare HMO | Admitting: Cardiology

## 2019-04-28 DIAGNOSIS — I712 Thoracic aortic aneurysm, without rupture, unspecified: Secondary | ICD-10-CM

## 2019-04-28 DIAGNOSIS — R0609 Other forms of dyspnea: Secondary | ICD-10-CM

## 2019-04-28 DIAGNOSIS — R0600 Dyspnea, unspecified: Secondary | ICD-10-CM

## 2019-04-28 DIAGNOSIS — I208 Other forms of angina pectoris: Secondary | ICD-10-CM | POA: Diagnosis not present

## 2019-04-28 DIAGNOSIS — E785 Hyperlipidemia, unspecified: Secondary | ICD-10-CM | POA: Diagnosis not present

## 2019-04-28 DIAGNOSIS — Q250 Patent ductus arteriosus: Secondary | ICD-10-CM

## 2019-04-28 NOTE — Patient Instructions (Signed)
Medication Instructions:  No changes  *If you need a refill on your cardiac medications before your next appointment, please call your pharmacy*   Lab Work:  Not needed   Testing/Procedures: Not needed   Follow-Up: At [facility], you and your health needs are our priority.  As part of our continuing mission to provide you with exceptional heart care, we have created designated Provider Care Teams.  These Care Teams include your primary Cardiologist (physician) and Advanced Practice Providers (APPs -  Physician Assistants and Nurse Practitioners) who all work together to provide you with the care you need, when you need it.  We recommend signing up for the patient portal called "MyChart".  Sign up information is provided on this After Visit Summary.  MyChart is used to connect with patients for Virtual Visits (Telemedicine).  Patients are able to view lab/test results, encounter notes, upcoming appointments, etc.  Non-urgent messages can be sent to your provider as well.   To learn more about what you can do with MyChart, go to [email or web address].    Your next appointment:   As needed     The format for your next appointment:   Either In Person or Virtual  Provider:   You may see Dr Glenetta Hew  or one of the following Advanced Practice Providers on your designated Care Team:    Rosaria Ferries, PA-C  Jory Sims, DNP, ANP  Cadence Kathlen Mody, NP    Other Instructions n/a

## 2019-04-28 NOTE — Progress Notes (Signed)
Primary Care Provider: Maude Leriche, PA-C Cardiologist: No primary care provider on file. Electrophysiologist:   Clinic Note: Chief Complaint  Patient presents with  . Follow-up    Test results: Coronary CTA and echocardiogram  . Shortness of Breath    Stable    HPI:    Isabel Fleming is a 74 y.o. female with PMH notable for hyperlipidemia and obesity with OSA-CPAP who is being seen today for follow-up evaluation of Shortness of Breath on Exertion and Aortic Atherosclerosis (as noted on CT scan chest x-ray and reevaluate in a.m. we will resume) at the request of Scifres, Earlie Server, PA-C who saw her back in November 2020 with complaints of exertional dyspnea and chest heaviness.[name] seen in initial consultation on February 21, 2019 still noting exertional dyspnea when going up an incline or carrying something heavy.  She may or may not note chest tightness if she continues after the dyspnea begins.  Intermittently associated with lightheadedness and dizziness.  Initial test ordered was echocardiogram and coronary CT angiogram.  We added additional CT of the chest to be ordered to evaluate ascending aorta  Recent Hospitalizations: None  Reviewed  CV studies:    The following studies were reviewed today: (if available, images/films reviewed: From Epic Chart or Care Everywhere) . TTE 03/15/2019 TTE : EF 60 to 65%.  GR 1 DD.  Aortic sclerosis, no stenosis.  Mild aortic root dilation 4.5 mm.  Referred for CT scan . Coronary CTA 03/31/2019: Calcium score 0.  No significant disease noted.  4.1 mm centimeter ascending thoracic aneurysm.  Recommend annual follow-up.  Areas of old granulomatous disease and lung scarring bilaterally.  No effusions or confluent opacities.   Interval History:   [name] returns here today for follow-up after studies.  She notes that she has rare off-and-on chest discomfort.  She still has shortness of breath on exertion but notes that it is  also a bit better than it had been.  She has not really been all that active, and does acknowledge that she may be somewhat deconditioned.  She still has skipped beats here and there but nothing prolonged or worrisome.  CV Review of Symptoms (Summary): positive for - End of day swelling, exertional dyspnea palpitations. negative for - chest pain, orthopnea, paroxysmal nocturnal dyspnea, rapid heart rate, shortness of breath or Syncope/near syncope TIA/amaurosis fugax, claudication  The patient DOES NOT have symptoms concerning for COVID-19 infection (fever, chills, cough, or new shortness of breath).  The patient IS practicing social distancing, and masking.  Does not go out much.  Is still thinking about getting COVID-19 vaccine.   REVIEWED OF SYSTEMS   A comprehensive ROS was performed. Review of Systems  Constitutional: Positive for malaise/fatigue (Decreased exercise tolerance).  HENT: Negative for congestion and nosebleeds.   Respiratory: Negative for shortness of breath (Only with exertion) and wheezing.        She sleeps somewhat elevated mostly because of comfort and GERD.  Gastrointestinal: Negative for abdominal pain, blood in stool, constipation, heartburn and melena.  Genitourinary: Negative for hematuria.  Musculoskeletal: Positive for joint pain (History of left knee meniscal tear.  Has swelling associated.). Negative for myalgias.  Skin: Negative for rash.  Neurological: Positive for dizziness.  Psychiatric/Behavioral: The patient has insomnia (Maybe 1 day out of the week she will have difficulties with them.).   All other systems reviewed and are negative.  I have reviewed and (if needed) personally updated the patient's problem  list, medications, allergies, past medical and surgical history, social and family history.   PAST MEDICAL HISTORY   Past Medical History:  Diagnosis Date  . Aortic atherosclerosis ([place]) 02/21/2019   With mild aortic root dilation (4.5 mm by  echo, 4.1 mm a sending aorta by CTA)  . Complication of anesthesia    "went beserk after 1st teflon removal surgery"  . Depression    Not taking medication  . Dyslipidemia, goal LDL below 100   . GERD (gastroesophageal reflux disease)    Takes intermittent PPI  . History of vertigo   . Moderate obesity    BMI 38  . OSA on CPAP    cpap setting of 3, pt uses some nights  . 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 Patent ductus arteriosus 60 yrs ago   repaired age 25yr old  . Wears dentures    top    PAST SURGICAL HISTORY   Past Surgical History:  Procedure Laterality Date  . ABDOMINAL HYSTERECTOMY  yrs ago  . COLONOSCOPY WITH PROPOFOL  01/06/2012   Procedure: COLONOSCOPY WITH PROPOFOL;  Surgeon: Garlan Fair, MD;  Location: [facility];  Service: Endoscopy;  Laterality: N/A;  . CORONARY CT ANGIOGRAM  03/2018   Calcium score 0.  No significant disease noted.  4.1 mm centimeter ascending thoracic aneurysm.  Recommend annual follow-up.  Areas of old granulomatous disease and lung scarring bilaterally.  No effusions or confluent opacities.  Marland Kitchen DILATION AND CURETTAGE OF UTERUS    . KNEE ARTHROSCOPY WITH MEDIAL MENISECTOMY Left 07/15/2012   Procedure: LEFT KNEE ARTHROSCOPY WITH MEDIAL/LATERAL MENISECTOMY, PATELLA CHONDROPLASTY;  Surgeon: Ninetta Lights, MD;  Location: [employer] [facility];  Service: Orthopedics;  Laterality: Left;  . PATENT DUCTUS ARTERIOUS REPAIR  45 yrs ago-1969  . poly removed from vocal cord  4 1/2 yrs ago  . SHOULDER ARTHROSCOPY WITH SUBACROMIAL DECOMPRESSION, ROTATOR CUFF REPAIR AND BICEP TENDON REPAIR Right 05/12/2013   Procedure: RIGHT SHOULDER ARTHROSCOPY WITH DEBRIDEMENT ROTATOR CUFF, RELEASE BICEPS, DISTAL CLAVICLE RESECTION, SUBACROMIAL DECOMPRESSION;  Surgeon: Ninetta Lights, MD;  Location: [facility];  Service: Orthopedics;  Laterality: Right;  . surgery for paralyzed vocal cord     x 2, teflon  injections done, some teflon removed 5 yrs ago x 2  . TRANSTHORACIC ECHOCARDIOGRAM  03/14/2018   EF 60 to 65%.  GR 1 DD.  Aortic sclerosis, no stenosis.  Mild aortic root dilation 4.5 mm.  Referred for CT scan    MEDICATIONS/ALLERGIES   Current Meds  Medication Sig  . atorvastatin (LIPITOR) 10 MG tablet Take 10 mg by mouth daily.  . bisacodyl (DULCOLAX) 5 MG EC tablet Take 1 tablet (5 mg total) by mouth daily as needed for moderate constipation.  Marland Kitchen ibuprofen (ADVIL,MOTRIN) 200 MG tablet Take 200 mg by mouth every 6 (six) hours as needed. For pain  . metoprolol tartrate (LOPRESSOR) 50 MG tablet Take 2 tablets (100 mg total) by mouth once for 1 dose. TAKE ONE HOUR PRIOR TO  SCHEDULE CARDIAC TEST  . Multiple Vitamin (MULTIVITAMIN WITH MINERALS) TABS Take 1 tablet by mouth daily.  . ondansetron (ZOFRAN) 4 MG tablet Take 1 tablet (4 mg total) by mouth every 8 (eight) hours as needed for nausea or vomiting.  . sodium chloride (OCEAN) 0.65 % nasal spray Place 1 spray into the nose daily as needed. For nasal decongestant  . vitamin B-12 (CYANOCOBALAMIN) 1000 MCG tablet Take  1,000 mcg by mouth daily.   Only takes atorvastatin intermittently  No Known Allergies   SOCIAL HISTORY/FAMILY HISTORY   Social History   Tobacco Use  . Smoking status: Never Smoker  . Smokeless tobacco: Never Used  Substance Use Topics  . Alcohol use: No  . Drug use: No   Social History   Social History Narrative   Married mother of 2, grandmother of 1.   Never smoked.  Does not drink.   Diet: Tries to have low globulin, low sugar.      Retired in April 2019 from [employer] (formerly [employer])      Does not do much walking currently because of left meniscal tear.  Pre-Covid used to try to water aerobics.    Family History family history includes Bipolar disorder in her sister; Breast cancer in her mother; CAD in her father; Heart attack in her father; Lung cancer in her father; Uterine cancer in her  mother.   OBJCTIVE -PE, EKG, labs   Wt Readings from Last 3 Encounters:  04/28/19 229 lb 9.6 oz (104.1 kg)  02/21/19 228 lb (103.4 kg)  05/12/13 211 lb 6 oz (95.9 kg)    Physical Exam: BP 110/70   Pulse 89   Ht 5\' 5"  (1.651 m)   Wt 229 lb 9.6 oz (104.1 kg)   SpO2 96%   BMI 38.21 kg/m  Physical Exam  Constitutional: She is oriented to person, place, and time. She appears well-developed and well-nourished. No distress.  Borderline morbidly obese.  Well-groomed.  Pleasant  HENT:  Head: Normocephalic and atraumatic.  Neck: No hepatojugular reflux and no JVD present. Carotid bruit is not present.  Cardiovascular: Normal rate, regular rhythm, S1 normal, S2 normal and intact distal pulses.  No extrasystoles are present. PMI is not displaced (Difficult to palpate). Exam reveals gallop, S4 (Versus split S2) and distant heart sounds.  No murmur heard. Pulmonary/Chest: Effort normal. No respiratory distress. She has no wheezes. She has no rales.  Somewhat distant breath sounds  Musculoskeletal:        General: Edema (Trivial left greater than right ankle) present. Normal range of motion.     Cervical back: Normal range of motion and neck supple.  Neurological: She is alert and oriented to person, place, and time. No cranial nerve deficit.  Skin: No erythema.  Psychiatric: She has a normal mood and affect. Her behavior is normal. Judgment and thought content normal.  Vitals reviewed.    Adult ECG Report n/a  Recent Labs: December 23, 2018  Na+ 141, K+ 4.3, Cl- 102, HCO3-32, BUN 11, Cr 0.82, Glu 88, Ca2+ 9.3; AST 19, ALT 14, AlkP 94  CBC: W 7.9, H/H 14.2/42.1, Plt 273  TC 170, TG 122, HDL 58, LDL 87  No results found for: CHOL, HDL, LDLCALC, LDLDIRECT, TRIG, CHOLHDL Lab Results  Component Value Date   CREATININE 0.81 03/29/2019   BUN 10 03/29/2019   NA 139 03/29/2019   K 4.7 03/29/2019   CL 101 03/29/2019   CO2 26 03/29/2019    ASSESSMENT/PLAN    Problem List Items  Addressed This Visit    Dyslipidemia, goal LDL below 100 (Chronic)    Relatively minimal coronary calcium score with no evidence of significant CAD. Probably okay to continue low-dose atorvastatin.  With target LDL between 100 130, likely would be better to shoot for less than 100.      Patent ductus arteriosus-status post repair (Chronic)    No finding on CT  scan that would suggest any issues from the surgery.      Thoracic aortic aneurysm (HCC) (Chronic)    Echocardiogram tends to overestimate the aortic size.  CTA only da424.1. would recommend that she has follow-up echocardiogram may be at least 1 time next year and then if stable can space it out.  If she does return to see me, we can we can make this happen otherwise will defer to PCP to schedule CTA of the chest-aorta in January of next year. Echocardiogram was otherwise normal.  I think her PCP can follow-up with the CT scans.      DOE (dyspnea on exertion)    Both echocardiogram and Coronary CTA do not show any evidence of significant disease a cardiac standpoint.  This would argue against a cardiac etiology.  Extent of diastolic dysfunction would probably not explain her dyspnea.  Most likely related to deconditioning.  Would simply continue to treat cardiac risk factors.      Atypical angina (HCC)    Symptoms initially sounded concerning for possible angina, however coronary CTA is essentially normal with no significant disease noted in the coronary cath score 0. While microvascular disease could be possible, a nonanginal etiology is probably the most likely. With the possibility of being a microvascular component, could consider calcium channel blocker either diltiazem or amlodipine for symptom relief and follow-up.          COVID-19 Education: The signs and symptoms of COVID-19 were discussed with the patient and how to seek care for testing (follow up with PCP or arrange E-visit).   The importance of social distancing  was discussed today.  I spent a total of 18 minutes with the patient and chart review. >  50% of the time was spent in direct patient consultation.  Additional time spent with chart review (studies, outside notes, etc): 10 Total Time: 28 min   Current medicines are reviewed at length with the patient today.  (+/- concerns) n/a   Patient Instructions / Medication Changes & Studies & Tests Ordered   Patient Instructions  Medication Instructions:  No changes  *If you need a refill on your cardiac medications before your next appointment, please call your pharmacy*   Lab Work:  Not needed   Testing/Procedures: Not needed   Follow-Up: At [facility], you and your health needs are our priority.  As part of our continuing mission to provide you with exceptional heart care, we have created designated Provider Care Teams.  These Care Teams include your primary Cardiologist (physician) and Advanced Practice Providers (APPs -  Physician Assistants and Nurse Practitioners) who all work together to provide you with the care you need, when you need it.  We recommend signing up for the patient portal called "MyChart".  Sign up information is provided on this After Visit Summary.  MyChart is used to connect with patients for Virtual Visits (Telemedicine).  Patients are able to view lab/test results, encounter notes, upcoming appointments, etc.  Non-urgent messages can be sent to your provider as well.   To learn more about what you can do with MyChart, go to [email or web address].    Your next appointment:   As needed     The format for your next appointment:   Either In Person or Virtual  Provider:   You may see Dr Glenetta Hew  or one of the following Advanced Practice Providers on your designated Care Team:    Rosaria Ferries, PA-C  Jory Sims, DNP, ANP  Cadence Kathlen Mody, NP    Other Instructions n/a   Studies Ordered:   No orders of the defined types were placed  in this encounter.    Glenetta Hew, M.D., M.S. Interventional Cardiologist   Pager # [phone] Phone # [phone] [address]. [place], [place] [address]   Thank you for choosing Heartcare at [facility]!!

## 2019-05-04 ENCOUNTER — Encounter: Payer: Self-pay | Admitting: Cardiology

## 2019-05-04 NOTE — Assessment & Plan Note (Signed)
Relatively minimal coronary calcium score with no evidence of significant CAD. Probably okay to continue low-dose atorvastatin.  With target LDL between 100 130, likely would be better to shoot for less than 100.

## 2019-05-04 NOTE — Assessment & Plan Note (Signed)
Both echocardiogram and Coronary CTA do not show any evidence of significant disease a cardiac standpoint.  This would argue against a cardiac etiology.  Extent of diastolic dysfunction would probably not explain her dyspnea.  Most likely related to deconditioning.  Would simply continue to treat cardiac risk factors.

## 2019-05-04 NOTE — Assessment & Plan Note (Addendum)
Echocardiogram tends to overestimate the aortic size.  CTA only da424.1. would recommend that she has follow-up echocardiogram may be at least 1 time next year and then if stable can space it out.  If she does return to see me, we can we can make this happen otherwise will defer to PCP to schedule CTA of the chest-aorta in January of next year. Echocardiogram was otherwise normal.  I think her PCP can follow-up with the CT scans.

## 2019-05-04 NOTE — Assessment & Plan Note (Signed)
No finding on CT scan that would suggest any issues from the surgery.

## 2019-05-04 NOTE — Assessment & Plan Note (Signed)
Symptoms initially sounded concerning for possible angina, however coronary CTA is essentially normal with no significant disease noted in the coronary cath score 0. While microvascular disease could be possible, a nonanginal etiology is probably the most likely. With the possibility of being a microvascular component, could consider calcium channel blocker either diltiazem or amlodipine for symptom relief and follow-up.

## 2019-09-17 DIAGNOSIS — R42 Dizziness and giddiness: Secondary | ICD-10-CM | POA: Diagnosis not present

## 2019-09-29 ENCOUNTER — Encounter: Payer: Self-pay | Admitting: Neurology

## 2019-11-11 DIAGNOSIS — R42 Dizziness and giddiness: Secondary | ICD-10-CM | POA: Diagnosis not present

## 2019-12-22 ENCOUNTER — Ambulatory Visit: Payer: Medicare HMO | Admitting: Neurology

## 2019-12-28 DIAGNOSIS — Z23 Encounter for immunization: Secondary | ICD-10-CM | POA: Diagnosis not present

## 2019-12-28 DIAGNOSIS — E785 Hyperlipidemia, unspecified: Secondary | ICD-10-CM | POA: Diagnosis not present

## 2019-12-28 DIAGNOSIS — R8290 Unspecified abnormal findings in urine: Secondary | ICD-10-CM | POA: Diagnosis not present

## 2019-12-28 DIAGNOSIS — Z0000 Encounter for general adult medical examination without abnormal findings: Secondary | ICD-10-CM | POA: Diagnosis not present

## 2019-12-28 DIAGNOSIS — G4733 Obstructive sleep apnea (adult) (pediatric): Secondary | ICD-10-CM | POA: Diagnosis not present

## 2020-01-04 DIAGNOSIS — M545 Low back pain, unspecified: Secondary | ICD-10-CM | POA: Diagnosis not present

## 2020-01-04 DIAGNOSIS — R292 Abnormal reflex: Secondary | ICD-10-CM | POA: Diagnosis not present

## 2020-01-20 ENCOUNTER — Other Ambulatory Visit: Payer: Self-pay | Admitting: Physician Assistant

## 2020-01-20 DIAGNOSIS — E2839 Other primary ovarian failure: Secondary | ICD-10-CM

## 2020-01-20 DIAGNOSIS — Z780 Asymptomatic menopausal state: Secondary | ICD-10-CM

## 2020-01-20 DIAGNOSIS — Z1231 Encounter for screening mammogram for malignant neoplasm of breast: Secondary | ICD-10-CM

## 2020-01-26 DIAGNOSIS — R292 Abnormal reflex: Secondary | ICD-10-CM | POA: Diagnosis not present

## 2020-01-26 DIAGNOSIS — M4802 Spinal stenosis, cervical region: Secondary | ICD-10-CM | POA: Diagnosis not present

## 2020-01-26 DIAGNOSIS — R69 Illness, unspecified: Secondary | ICD-10-CM | POA: Diagnosis not present

## 2020-01-26 DIAGNOSIS — M48061 Spinal stenosis, lumbar region without neurogenic claudication: Secondary | ICD-10-CM | POA: Diagnosis not present

## 2020-01-26 DIAGNOSIS — M545 Low back pain, unspecified: Secondary | ICD-10-CM | POA: Diagnosis not present

## 2020-01-29 ENCOUNTER — Encounter: Payer: Self-pay | Admitting: Physician Assistant

## 2020-01-30 DIAGNOSIS — M545 Low back pain, unspecified: Secondary | ICD-10-CM | POA: Diagnosis not present

## 2020-04-10 DIAGNOSIS — L308 Other specified dermatitis: Secondary | ICD-10-CM | POA: Diagnosis not present

## 2020-04-27 ENCOUNTER — Ambulatory Visit: Payer: Medicare HMO

## 2020-04-27 ENCOUNTER — Other Ambulatory Visit: Payer: Medicare HMO

## 2020-05-03 ENCOUNTER — Ambulatory Visit
Admission: RE | Admit: 2020-05-03 | Discharge: 2020-05-03 | Disposition: A | Discharge status: Home / Self Care | Payer: Medicare HMO | Source: Ambulatory Visit | Attending: Physician Assistant | Admitting: Physician Assistant

## 2020-05-03 ENCOUNTER — Other Ambulatory Visit: Payer: Self-pay

## 2020-05-03 DIAGNOSIS — Z1231 Encounter for screening mammogram for malignant neoplasm of breast: Secondary | ICD-10-CM | POA: Diagnosis not present

## 2020-08-06 DIAGNOSIS — L308 Other specified dermatitis: Secondary | ICD-10-CM | POA: Diagnosis not present

## 2020-08-06 DIAGNOSIS — D1801 Hemangioma of skin and subcutaneous tissue: Secondary | ICD-10-CM | POA: Diagnosis not present

## 2020-08-06 DIAGNOSIS — L820 Inflamed seborrheic keratosis: Secondary | ICD-10-CM | POA: Diagnosis not present

## 2020-08-06 DIAGNOSIS — Z85828 Personal history of other malignant neoplasm of skin: Secondary | ICD-10-CM | POA: Diagnosis not present

## 2020-08-06 DIAGNOSIS — D229 Melanocytic nevi, unspecified: Secondary | ICD-10-CM | POA: Diagnosis not present

## 2020-08-06 DIAGNOSIS — H0015 Chalazion left lower eyelid: Secondary | ICD-10-CM | POA: Diagnosis not present

## 2020-08-06 DIAGNOSIS — L814 Other melanin hyperpigmentation: Secondary | ICD-10-CM | POA: Diagnosis not present

## 2020-08-06 DIAGNOSIS — L905 Scar conditions and fibrosis of skin: Secondary | ICD-10-CM | POA: Diagnosis not present

## 2020-08-06 DIAGNOSIS — L821 Other seborrheic keratosis: Secondary | ICD-10-CM | POA: Diagnosis not present

## 2020-09-05 DIAGNOSIS — Z01419 Encounter for gynecological examination (general) (routine) without abnormal findings: Secondary | ICD-10-CM | POA: Diagnosis not present

## 2020-09-28 ENCOUNTER — Ambulatory Visit
Admission: RE | Admit: 2020-09-28 | Discharge: 2020-09-28 | Disposition: A | Discharge status: Home / Self Care | Payer: Medicare HMO | Source: Ambulatory Visit | Attending: Physician Assistant | Admitting: Physician Assistant

## 2020-09-28 ENCOUNTER — Other Ambulatory Visit: Payer: Self-pay

## 2020-09-28 DIAGNOSIS — Z780 Asymptomatic menopausal state: Secondary | ICD-10-CM

## 2020-09-28 DIAGNOSIS — M85832 Other specified disorders of bone density and structure, left forearm: Secondary | ICD-10-CM | POA: Diagnosis not present

## 2020-09-28 DIAGNOSIS — E2839 Other primary ovarian failure: Secondary | ICD-10-CM

## 2020-10-12 DIAGNOSIS — H2513 Age-related nuclear cataract, bilateral: Secondary | ICD-10-CM | POA: Diagnosis not present

## 2020-10-12 DIAGNOSIS — H524 Presbyopia: Secondary | ICD-10-CM | POA: Diagnosis not present

## 2020-10-12 DIAGNOSIS — H43813 Vitreous degeneration, bilateral: Secondary | ICD-10-CM | POA: Diagnosis not present

## 2020-10-12 DIAGNOSIS — H25013 Cortical age-related cataract, bilateral: Secondary | ICD-10-CM | POA: Diagnosis not present

## 2020-10-31 DIAGNOSIS — H31092 Other chorioretinal scars, left eye: Secondary | ICD-10-CM | POA: Diagnosis not present

## 2020-10-31 DIAGNOSIS — H43813 Vitreous degeneration, bilateral: Secondary | ICD-10-CM | POA: Diagnosis not present

## 2020-10-31 DIAGNOSIS — D3121 Benign neoplasm of right retina: Secondary | ICD-10-CM | POA: Diagnosis not present

## 2020-12-07 DIAGNOSIS — L309 Dermatitis, unspecified: Secondary | ICD-10-CM | POA: Diagnosis not present

## 2020-12-07 DIAGNOSIS — E785 Hyperlipidemia, unspecified: Secondary | ICD-10-CM | POA: Diagnosis not present

## 2020-12-07 DIAGNOSIS — G4733 Obstructive sleep apnea (adult) (pediatric): Secondary | ICD-10-CM | POA: Diagnosis not present

## 2020-12-07 DIAGNOSIS — R69 Illness, unspecified: Secondary | ICD-10-CM | POA: Diagnosis not present

## 2020-12-07 DIAGNOSIS — M1990 Unspecified osteoarthritis, unspecified site: Secondary | ICD-10-CM | POA: Diagnosis not present

## 2020-12-07 DIAGNOSIS — Z803 Family history of malignant neoplasm of breast: Secondary | ICD-10-CM | POA: Diagnosis not present

## 2020-12-07 DIAGNOSIS — H547 Unspecified visual loss: Secondary | ICD-10-CM | POA: Diagnosis not present

## 2020-12-07 DIAGNOSIS — F325 Major depressive disorder, single episode, in full remission: Secondary | ICD-10-CM | POA: Diagnosis not present

## 2020-12-07 DIAGNOSIS — Z6836 Body mass index (BMI) 36.0-36.9, adult: Secondary | ICD-10-CM | POA: Diagnosis not present

## 2020-12-07 DIAGNOSIS — R030 Elevated blood-pressure reading, without diagnosis of hypertension: Secondary | ICD-10-CM | POA: Diagnosis not present

## 2020-12-07 DIAGNOSIS — I700 Atherosclerosis of aorta: Secondary | ICD-10-CM | POA: Diagnosis not present

## 2020-12-07 DIAGNOSIS — J309 Allergic rhinitis, unspecified: Secondary | ICD-10-CM | POA: Diagnosis not present

## 2020-12-24 DIAGNOSIS — B359 Dermatophytosis, unspecified: Secondary | ICD-10-CM | POA: Diagnosis not present

## 2020-12-24 DIAGNOSIS — L309 Dermatitis, unspecified: Secondary | ICD-10-CM | POA: Diagnosis not present

## 2020-12-24 DIAGNOSIS — L209 Atopic dermatitis, unspecified: Secondary | ICD-10-CM | POA: Diagnosis not present

## 2020-12-27 DIAGNOSIS — L309 Dermatitis, unspecified: Secondary | ICD-10-CM | POA: Diagnosis not present

## 2021-01-08 DIAGNOSIS — U071 COVID-19: Secondary | ICD-10-CM | POA: Diagnosis not present

## 2021-01-23 DIAGNOSIS — I700 Atherosclerosis of aorta: Secondary | ICD-10-CM | POA: Diagnosis not present

## 2021-01-23 DIAGNOSIS — Z6836 Body mass index (BMI) 36.0-36.9, adult: Secondary | ICD-10-CM | POA: Diagnosis not present

## 2021-01-23 DIAGNOSIS — Z8616 Personal history of COVID-19: Secondary | ICD-10-CM | POA: Diagnosis not present

## 2021-01-23 DIAGNOSIS — Z87828 Personal history of other (healed) physical injury and trauma: Secondary | ICD-10-CM | POA: Diagnosis not present

## 2021-01-23 DIAGNOSIS — K1379 Other lesions of oral mucosa: Secondary | ICD-10-CM | POA: Diagnosis not present

## 2021-01-23 DIAGNOSIS — R42 Dizziness and giddiness: Secondary | ICD-10-CM | POA: Diagnosis not present

## 2021-01-23 DIAGNOSIS — E785 Hyperlipidemia, unspecified: Secondary | ICD-10-CM | POA: Diagnosis not present

## 2021-01-23 DIAGNOSIS — Z0000 Encounter for general adult medical examination without abnormal findings: Secondary | ICD-10-CM | POA: Diagnosis not present

## 2021-01-24 ENCOUNTER — Encounter: Payer: Self-pay | Admitting: Neurology

## 2021-02-13 IMAGING — MG DIGITAL SCREENING BILAT W/ TOMO W/ CAD
8 series · 8 of 24 positions shown · non-contrast
Comparison: Previous exam(s).

CLINICAL DATA: Screening.

EXAM:
DIGITAL SCREENING BILATERAL MAMMOGRAM WITH TOMO AND CAD

[R CC synth-2D]
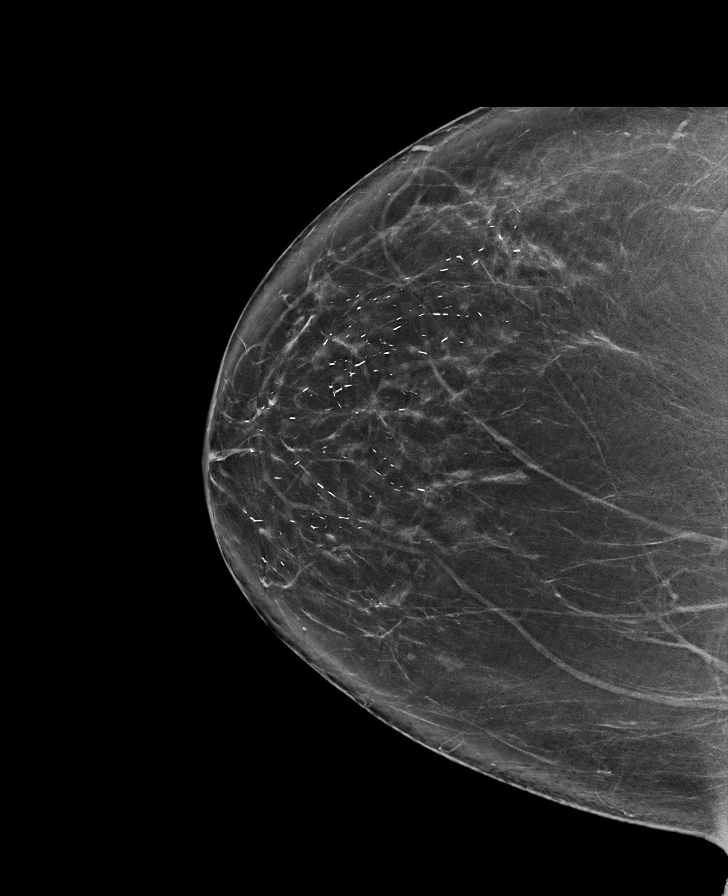

[L MLO synth-2D]
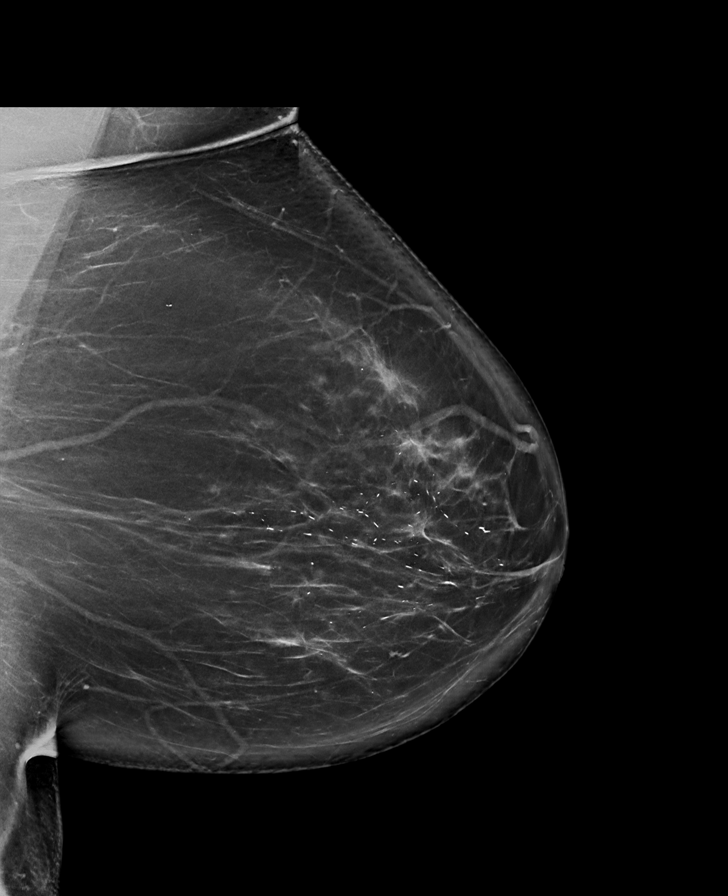

[L CC synth-2D]
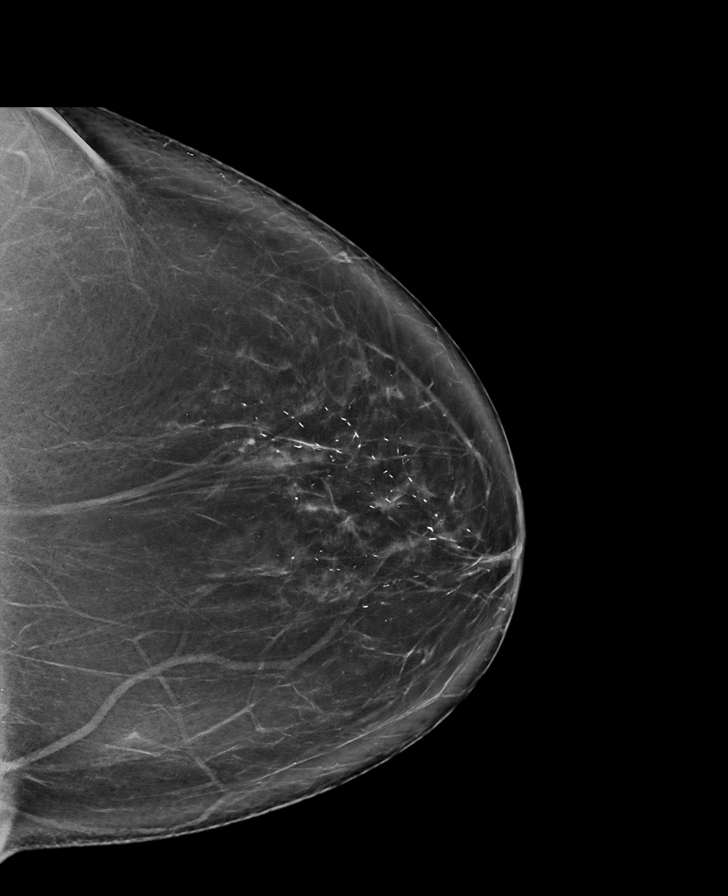

[R MLO synth-2D]
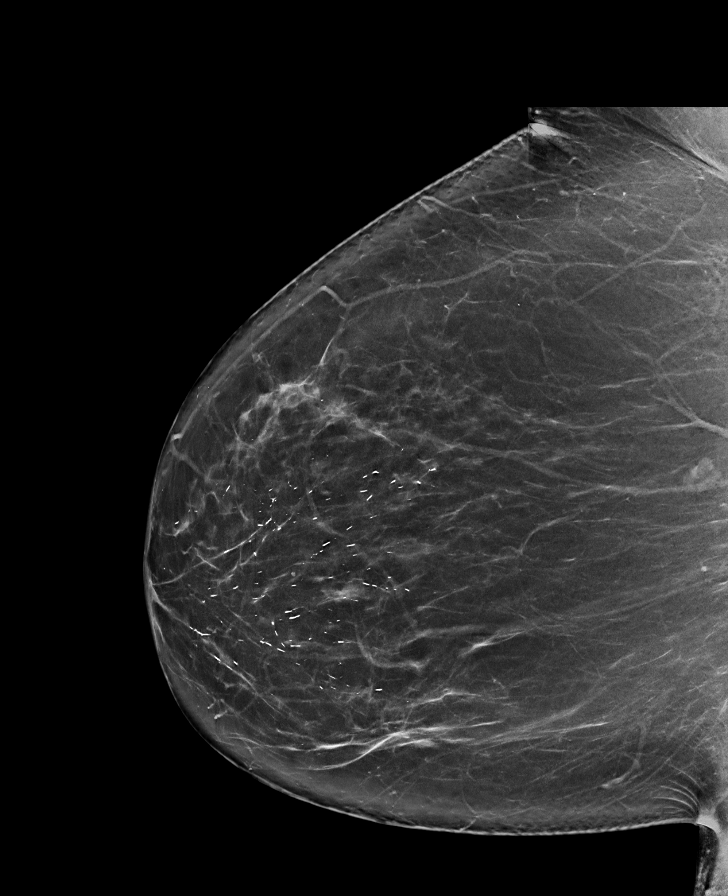

[L MLO tomo · tomo slice 49/98.0]
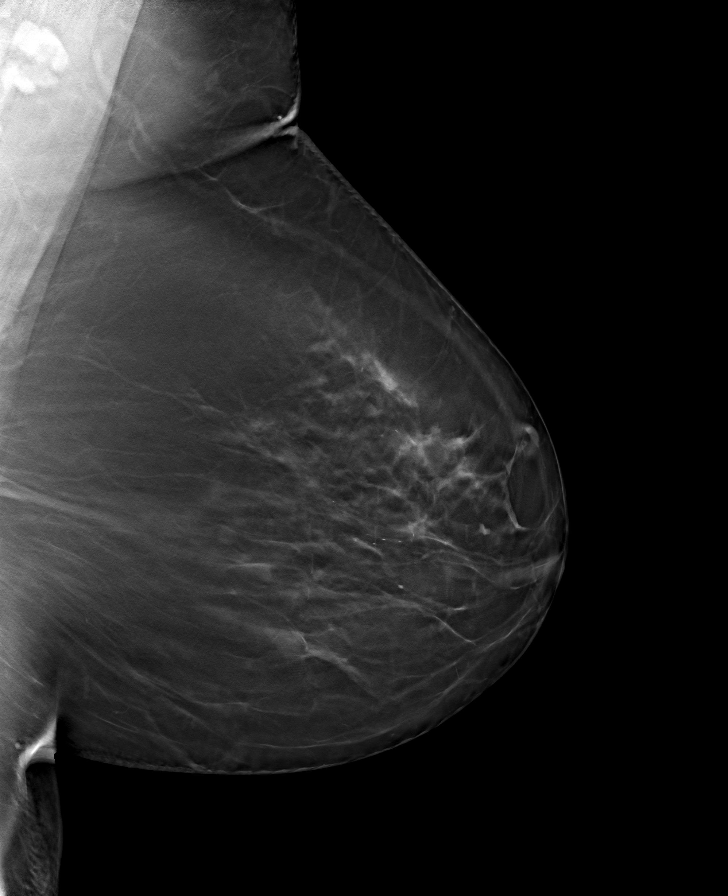

[R MLO tomo · tomo slice 45/89.0]
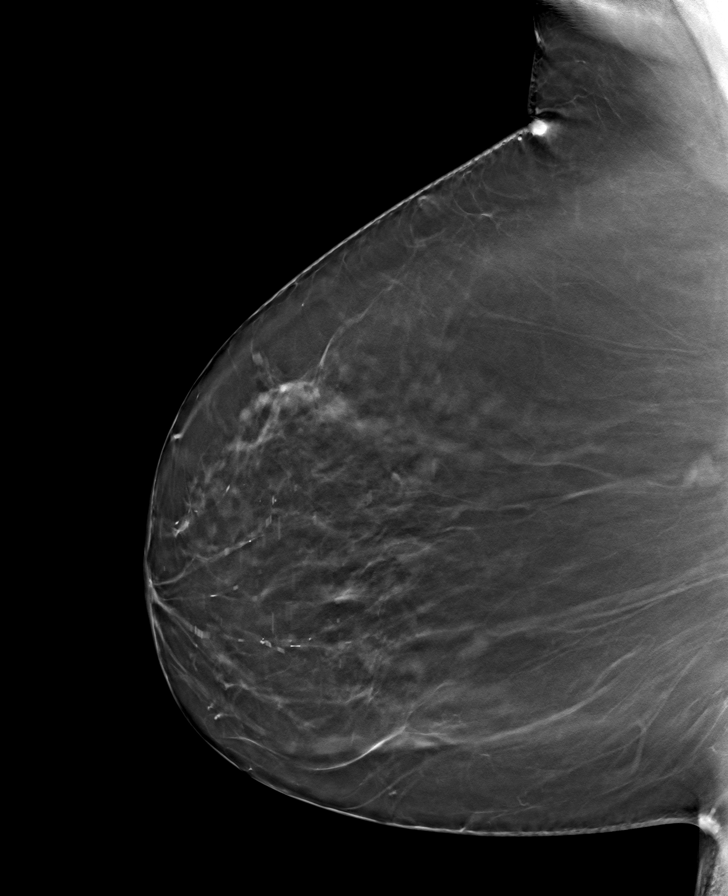

[L CC tomo · tomo slice 46/91.0]
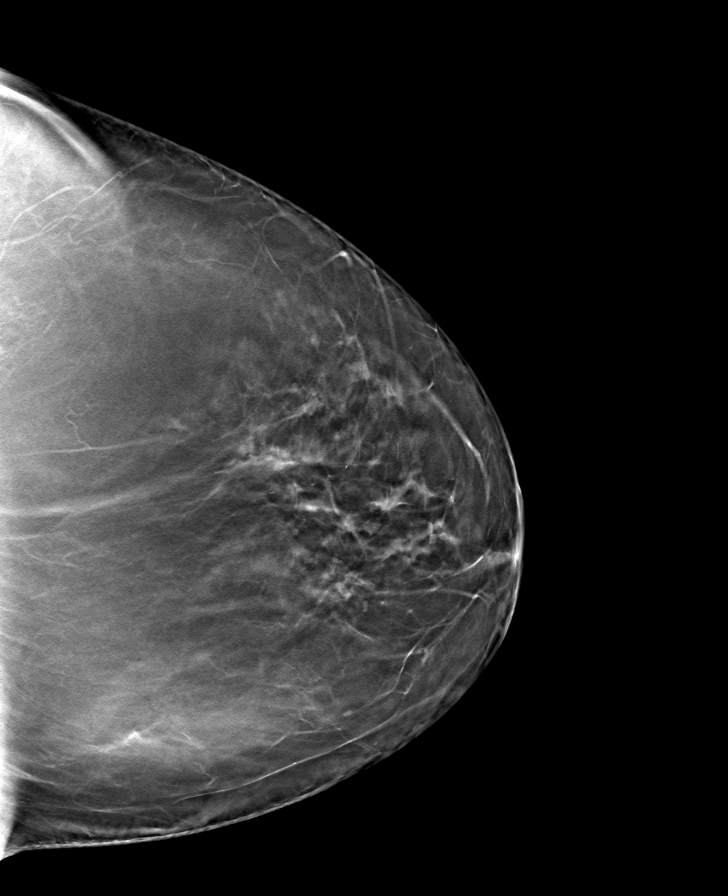

[R CC tomo · tomo slice 43/84.0]
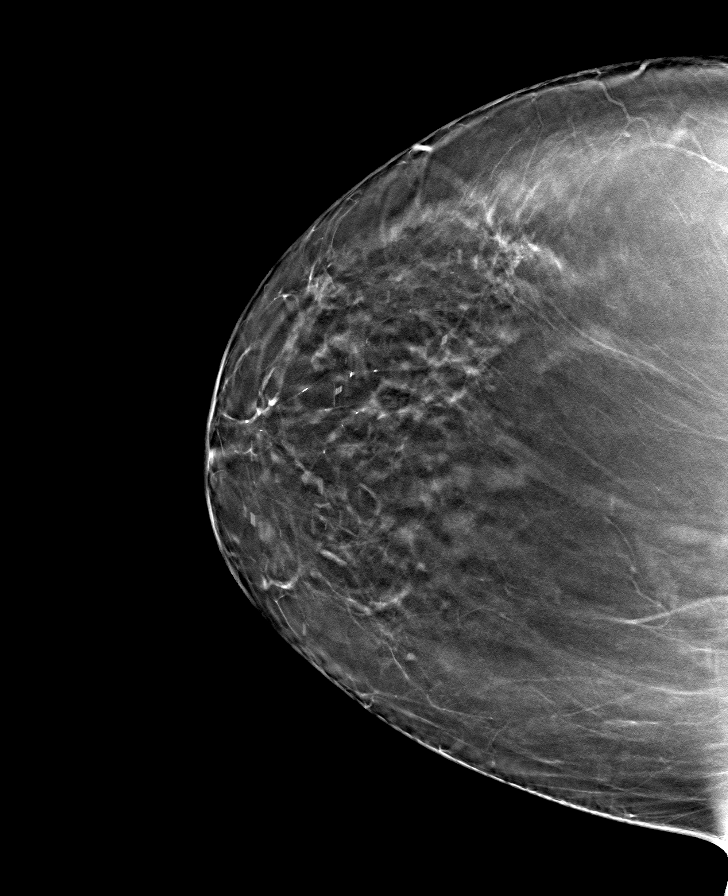

[8 of 24 positions shown; findings below may reference images not displayed]

ACR Breast Density Category b: There are scattered areas of
fibroglandular density.
FINDINGS: There are no findings suspicious for malignancy. Images were
processed with CAD.
IMPRESSION: No mammographic evidence of malignancy. A result letter of this
screening mammogram will be mailed directly to the patient.

RECOMMENDATION:
Screening mammogram in one year. (Code:[id number])

BI-RADS CATEGORY  1: Negative.

## 2021-02-15 ENCOUNTER — Other Ambulatory Visit: Payer: Self-pay | Admitting: Physician Assistant

## 2021-02-15 DIAGNOSIS — Z1231 Encounter for screening mammogram for malignant neoplasm of breast: Secondary | ICD-10-CM

## 2021-03-05 DIAGNOSIS — H31002 Unspecified chorioretinal scars, left eye: Secondary | ICD-10-CM | POA: Diagnosis not present

## 2021-03-05 DIAGNOSIS — H25013 Cortical age-related cataract, bilateral: Secondary | ICD-10-CM | POA: Diagnosis not present

## 2021-03-05 DIAGNOSIS — H2513 Age-related nuclear cataract, bilateral: Secondary | ICD-10-CM | POA: Diagnosis not present

## 2021-03-05 DIAGNOSIS — H5203 Hypermetropia, bilateral: Secondary | ICD-10-CM | POA: Diagnosis not present

## 2021-04-04 DIAGNOSIS — H25812 Combined forms of age-related cataract, left eye: Secondary | ICD-10-CM | POA: Diagnosis not present

## 2021-04-04 DIAGNOSIS — H25012 Cortical age-related cataract, left eye: Secondary | ICD-10-CM | POA: Diagnosis not present

## 2021-04-04 DIAGNOSIS — H2512 Age-related nuclear cataract, left eye: Secondary | ICD-10-CM | POA: Diagnosis not present

## 2021-04-23 ENCOUNTER — Ambulatory Visit: Payer: Medicare HMO | Admitting: Neurology

## 2021-04-24 ENCOUNTER — Other Ambulatory Visit: Payer: Self-pay

## 2021-04-24 ENCOUNTER — Encounter: Payer: Self-pay | Admitting: Physician Assistant

## 2021-04-24 ENCOUNTER — Ambulatory Visit: Payer: Medicare HMO | Admitting: Physician Assistant

## 2021-04-24 VITALS — BP 133/84 | HR 84 | Resp 18 | Ht 65.0 in | Wt 224.0 lb

## 2021-04-24 DIAGNOSIS — I719 Aortic aneurysm of unspecified site, without rupture: Secondary | ICD-10-CM | POA: Diagnosis not present

## 2021-04-24 DIAGNOSIS — R42 Dizziness and giddiness: Secondary | ICD-10-CM

## 2021-04-24 NOTE — Progress Notes (Addendum)
[facility] Clinic Note - Initial Visit   Date: 04/25/21  [name] MRN: [id number] DOB: 09/05/1945   DearScifres, Isabel Server, PA-C:  Thank you for your kind referral of Isabel Fleming for consultation of vertigo. Although her history is well known to you, please allow [place] to reiterate it for the purpose of our medical record. The patient is here alone.   History of Present Illness:  Isabel Fleming is a 76 y.o. R-handed female with a history of thoracic aortic aneurysm , patent ductus arteriosus s/p repair, hyperlipidemia, depression, IBS, chronic fatigue syndrome, OSA on CPAP presenting for evaluation of chronic vertigo.  She reports that the symptoms have been present for about 3 to 4 years, and has been seen at [facility] several years ago, with the same complaint, felt to be due to allergies "I was given some nasal spray and it did not help much ".  "I have good and bad days, when if I do not move my head I am okay ".  She always feels as she is floating.  She denies any history of migraines or stroke.  She states that if she turns her head to the left may be worse so she avoids sleeping on her left or moving her head to the left.  Initially she thought that "the sugars had something to do with it so I always carry something sweet in my purse ".  She denies any nausea or vomiting.  Sometimes she takes meclizine, with some help.  She has chronic left neck pain believed to be due to arthritis, occasionally taking Flexeril.  She denies any recent upper respiratory infection or flulike symptoms.  She does feel that her gait is unsteady if she stands for longer periods of time or if she stands up too quickly she had a recent cataract surgery on February 16, which may have exacerbated her symptoms "makes my head crazy ".  She states that she has vocal cord paralysis followed at [facility], and in the past she had Teflon implanted in her vocal cords, "but they are spreading, and the  doctor wants to remove them, I have an appointment soon ".  She denies any dysarthria.  No confusion or seizures.  Denies any chest pain or shortness of breath, fever or chills, or night sweats.  She denies any tobacco, new medications or hormonal supplements.  She denies any recent long distance trips or recent surgeries, or sick contacts.  No new stressors in her personal life.  She is compliant with the medications.   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  No significant disease noted.  4.1 mm centimeter ascending thoracic aneurysm.  Recommend annual follow-up.  Areas of old granulomatous disease and lung scarring bilaterally.  No effusions or confluent opacities.   DILATION AND CURETTAGE OF UTERUS     KNEE ARTHROSCOPY WITH MEDIAL MENISECTOMY Left 07/15/2012   Procedure: LEFT KNEE ARTHROSCOPY WITH MEDIAL/LATERAL MENISECTOMY, PATELLA CHONDROPLASTY;  Surgeon: Ninetta Lights, MD;  Location: [facility];  Service: Orthopedics;  Laterality: Left;   PATENT DUCTUS  ARTERIOUS REPAIR  45 yrs ago-1969   poly removed from vocal cord  4 1/2 yrs ago   SHOULDER ARTHROSCOPY WITH SUBACROMIAL DECOMPRESSION, ROTATOR CUFF REPAIR AND BICEP TENDON REPAIR Right 05/12/2013   Procedure: RIGHT SHOULDER ARTHROSCOPY WITH DEBRIDEMENT ROTATOR CUFF, RELEASE BICEPS, DISTAL CLAVICLE RESECTION, SUBACROMIAL DECOMPRESSION;  Surgeon: Ninetta Lights, MD;  Location: [facility];  Service: Orthopedics;  Laterality: Right;   surgery for paralyzed vocal cord     x 2, teflon injections done, some teflon removed 5 yrs ago x 2   TRANSTHORACIC ECHOCARDIOGRAM  03/14/2018   EF 60 to 65%.  GR 1 DD.  Aortic sclerosis, no stenosis.  Mild aortic root dilation 4.5 mm.  Referred for CT scan     Medications:  Outpatient Encounter Medications as of 04/24/2021  Medication Sig   atorvastatin (LIPITOR) 10 MG tablet Take 10 mg by mouth daily.   bisacodyl (DULCOLAX) 5 MG EC tablet Take 1 tablet (5 mg total) by mouth daily as needed for moderate constipation.   ibuprofen (ADVIL,MOTRIN) 200 MG tablet Take 200 mg by mouth every 6 (six) hours as needed. For pain   Multiple Vitamin (MULTIVITAMIN WITH MINERALS) TABS Take 1 tablet by mouth daily.   ondansetron (ZOFRAN) 4 MG tablet Take 1 tablet (4 mg total) by mouth every 8 (eight) hours as needed for nausea or vomiting.   sodium chloride (OCEAN) 0.65 % nasal spray Place 1 spray into the nose daily as needed. For nasal decongestant   vitamin B-12 (CYANOCOBALAMIN) 1000 MCG tablet Take 1,000 mcg by mouth daily.   metoprolol tartrate (LOPRESSOR) 50 MG tablet Take 2 tablets (100 mg total) by mouth once for 1 dose. TAKE ONE HOUR PRIOR TO  SCHEDULE CARDIAC TEST (Patient not taking: Reported on 04/24/2021)   No facility-administered encounter medications on file as of 04/24/2021.    Allergies: No Known Allergies  Family History: Family History  Problem Relation Age of Onset   Breast cancer Mother    Uterine cancer Mother    Heart attack Father    CAD  Father    Lung cancer Father    Bipolar disorder Sister        Depression, also has chronic pain    Social History: Social History   Tobacco Use   Smoking status: Never   Smokeless tobacco: Never  Substance Use Topics   Alcohol use: No   Drug use: No   Social History   Social History Narrative   Married mother of 2, grandmother of 1.   Never smoked.  Does not drink.   Diet: Tries to have low globulin, low sugar.      Retired in April 2019 from [employer] (formerly [employer])      Does not do much walking currently  because of left meniscal tear.  Pre-Covid used to try to water aerobics.   Right handed   Drinks caffeine   Two story home    Vital Signs:  BP 133/84    Pulse 84    Resp 18    Ht '5\' 5"'$  (1.651 m)    Wt 224 lb (101.6 kg)    SpO2 97%    BMI 37.28 kg/m     General Medical Exam:   General:  Well appearing, comfortable.   Eyes/ENT: see cranial nerve examination.   Neck:   No carotid bruits. Respiratory:  Clear to auscultation, good air entry bilaterally.   Cardiac:  Regular rate and rhythm, no murmur.   Extremities:  No deformities, edema, or skin discoloration.  Skin:  No rashes or lesions.  Neurological Exam: MENTAL STATUS including orientation to time, place, person, recent and remote memory, attention span and concentration, language, and fund of knowledge is normal.  Speech is not dysarthric.  CRANIAL NERVES: II:  No visual field defects.  Unremarkable fundi.   III-IV-VI: Pupils equal round and reactive to light.  Normal conjugate, extra-ocular eye movements in all directions of gaze.  No nystagmus.  No ptosis.   V:  Normal facial sensation.    VII:  Normal facial symmetry and movements.   VIII:  Normal hearing and vestibular function.   IX-X:  Normal palatal movement.   XI:  Normal shoulder shrug and head rotation.   XII:  Normal tongue strength and range of motion, no deviation or fasciculation.  MOTOR:  No atrophy, fasciculations or abnormal movements.   No pronator drift.    SENSORY:  Normal and symmetric perception of light touch, pinprick, vibration, and proprioception.  Romberg's sign absent.   COORDINATION/GAIT: Normal finger-to- nose-finger and heel-to-shin.  Intact rapid alternating movements bilaterally.  Able to rise from a chair without using arms.  Gait narrow based and stable, unless she stands for a longer period of times, where "I feel wobbly ". Tandem and stressed gait intact.    IMPRESSION/PLAN:   Chronic  Vertigo, unclear etiology Unclear etiology, however, the patient has significant cardiac history, including aortic aneurysm and PDA s/p repair as well as a history of labyrinthitis that requires further evaluation.  MRI of the brain MRA of the brain and neck 2D echo Referral to ENT Calloway, Audrie Gallus, MD  at [place] prn Referral to vestibular therapy Follow up in 1 month Use a cane to prevents falls   Total time spent: 70  minutes   Thank you for allowing me to participate in patient's care.  If I can answer any additional questions, I would be pleased to do so.    Sincerely,  Sharene Butters, PA-C

## 2021-04-24 NOTE — Patient Instructions (Addendum)
MRI brain ? MRA head and neck  ?Continue meclizine as needed ?Keep the ENT visit Calloway, Audrie Gallus, MD  at [facility]  ?2 D echo for abnormalities  ?Refer to Vestibular Therapy  ?Wear a cane for balance and to prevent falls  ?Follow up in 1 month  ? ? We have sent a referral to [facility] for your MRI and they will call you directly to schedule your appointment. They are located at [place]. If you need to contact them directly please call [phone] along with MRA head and neck ? ? ? ?

## 2021-05-02 ENCOUNTER — Ambulatory Visit (HOSPITAL_COMMUNITY): Payer: Medicare HMO | Attending: Cardiology

## 2021-05-02 ENCOUNTER — Other Ambulatory Visit: Payer: Self-pay

## 2021-05-02 DIAGNOSIS — I7121 Aneurysm of the ascending aorta, without rupture: Secondary | ICD-10-CM | POA: Insufficient documentation

## 2021-05-02 DIAGNOSIS — Z8774 Personal history of (corrected) congenital malformations of heart and circulatory system: Secondary | ICD-10-CM | POA: Insufficient documentation

## 2021-05-02 DIAGNOSIS — I351 Nonrheumatic aortic (valve) insufficiency: Secondary | ICD-10-CM | POA: Diagnosis not present

## 2021-05-02 DIAGNOSIS — R42 Dizziness and giddiness: Secondary | ICD-10-CM | POA: Insufficient documentation

## 2021-05-02 DIAGNOSIS — I719 Aortic aneurysm of unspecified site, without rupture: Secondary | ICD-10-CM

## 2021-05-02 DIAGNOSIS — E785 Hyperlipidemia, unspecified: Secondary | ICD-10-CM | POA: Insufficient documentation

## 2021-05-02 LAB — ECHOCARDIOGRAM COMPLETE
Area-P 1/2: 3.93 cm2
P 1/2 time: 500 msec
S' Lateral: 3.2 cm

## 2021-05-02 NOTE — Progress Notes (Unsigned)
440 ?

## 2021-05-03 ENCOUNTER — Ambulatory Visit: Payer: Medicare HMO

## 2021-05-03 DIAGNOSIS — H35372 Puckering of macula, left eye: Secondary | ICD-10-CM | POA: Diagnosis not present

## 2021-05-07 ENCOUNTER — Other Ambulatory Visit: Payer: Self-pay

## 2021-05-07 ENCOUNTER — Ambulatory Visit
Admission: RE | Admit: 2021-05-07 | Discharge: 2021-05-07 | Disposition: A | Discharge status: Home / Self Care | Payer: Medicare HMO | Source: Ambulatory Visit | Attending: Physician Assistant | Admitting: Physician Assistant

## 2021-05-07 DIAGNOSIS — Z1231 Encounter for screening mammogram for malignant neoplasm of breast: Secondary | ICD-10-CM

## 2021-05-08 ENCOUNTER — Ambulatory Visit: Payer: Medicare HMO | Attending: Physician Assistant

## 2021-05-08 DIAGNOSIS — R2681 Unsteadiness on feet: Secondary | ICD-10-CM | POA: Insufficient documentation

## 2021-05-08 DIAGNOSIS — R42 Dizziness and giddiness: Secondary | ICD-10-CM | POA: Insufficient documentation

## 2021-05-08 NOTE — Therapy (Signed)
?OUTPATIENT PHYSICAL THERAPY VESTIBULAR EVALUATION ? ? ? ? ?Patient Name: Isabel Fleming ?MRN: [id number] ?DOB:[birth year], 76 y.o., female ?Today's Date: 05/08/2021 ? ?PCP: Scifres, Dorothy, PA-C ?REFERRING PROVIDER: Rondel Jumbo, PA-C ? ? PT End of Session - 05/08/21 0931   ? ? Visit Number 1   ? Number of Visits 5   ? Date for PT Re-Evaluation 06/14/21   ? Authorization Type [employer]   ? Progress Note Due on Visit 10   ? PT Start Time [phone]   ? PT Stop Time 1015   ? PT Time Calculation (min) 44 min   ? Activity Tolerance Patient tolerated treatment well   ? Behavior During Therapy [facility] for tasks assessed/performed   ? ?  ?  ? ?  ? ? ?Past Medical History:  ?Diagnosis Date  ? Aortic atherosclerosis ([place]) 02/21/2019  ? With mild aortic root dilation (4.5 mm by echo, 4.1 mm a sending aorta by CTA)  ? Complication of anesthesia   ? "went beserk after 1st teflon removal surgery"  ? Depression   ? Not taking medication  ? Dyslipidemia, goal LDL below 100   ? GERD (gastroesophageal reflux disease)   ? Takes intermittent PPI  ? History of vertigo   ? Moderate obesity   ? BMI 38  ? OSA on CPAP   ? cpap setting of 3, pt uses some nights  ? 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 one vocal cord  ? Patent ductus arteriosus 45 yrs ago  ? repaired age 89yrold  ? Wears dentures   ? top  ? ?Past Surgical History:  ?Procedure Laterality Date  ? ABDOMINAL HYSTERECTOMY  yrs ago  ? COLONOSCOPY WITH PROPOFOL  01/06/2012  ? Procedure: COLONOSCOPY WITH PROPOFOL;  Surgeon: MGarlan Fair MD;  [facility];  Service: Endoscopy;  Laterality: N/A;  ? CORONARY CT ANGIOGRAM  03/2018  ? Calcium score 0.  No significant disease noted.  4.1 mm centimeter ascending thoracic aneurysm.  Recommend annual follow-up.  Areas of old granulomatous disease and lung scarring bilaterally.  No effusions or confluent opacities.  ? DILATION AND CURETTAGE OF UTERUS    ? KNEE ARTHROSCOPY WITH MEDIAL MENISECTOMY Left 07/15/2012   ? Procedure: LEFT KNEE ARTHROSCOPY WITH MEDIAL/LATERAL MENISECTOMY, PATELLA CHONDROPLASTY;  Surgeon: DNinetta Lights MD;  [place]  Service: Orthopedics;  Laterality: Left;  ? PATENT DUCTUS ARTERIOUS REPAIR  45 yrs ago-1969  ? poly removed from vocal cord  4 1/2 yrs ago  ? SHOULDER ARTHROSCOPY WITH SUBACROMIAL DECOMPRESSION, ROTATOR CUFF REPAIR AND BICEP TENDON REPAIR Right 05/12/2013  ? Procedure: RIGHT SHOULDER ARTHROSCOPY WITH DEBRIDEMENT ROTATOR CUFF, RELEASE BICEPS, DISTAL CLAVICLE RESECTION, SUBACROMIAL DECOMPRESSION;  Surgeon: DNinetta Lights MD;  [place]  Service: Orthopedics;  Laterality: Right;  ? surgery for paralyzed vocal cord    ? x 2, teflon injections done, some teflon removed 5 yrs ago x 2  ? TRANSTHORACIC ECHOCARDIOGRAM  03/14/2018  ? EF 60 to 65%.  GR 1 DD.  Aortic sclerosis, no stenosis.  Mild aortic root dilation 4.5 mm.  Referred for CT scan  ? ?Patient Active Problem List  ? Diagnosis Date Noted  ? Thoracic aortic aneurysm 03/16/2019  ? [place] atherosclerosis ([place] 02/21/2019  ? DOE (dyspnea on exertion) 02/21/2019  ? [place]ina [place] 02/21/2019  ? Patent ductus arteriosus-status post repair   ? Dyslipidemia, goal LDL below 100   ? Impingement  syndrome of right shoulder 05/12/2013  ? ? ?ONSET DATE: 04/24/21 (Referral Date) ? ?REFERRING DIAG: R42 (ICD-10-CM) - Vertigo ? ?THERAPY DIAG:  ?Dizziness and giddiness ? ?Unsteadiness on feet ? ?SUBJECTIVE:  ? ?SUBJECTIVE STATEMENT: ?Patient reports chronic history of dizziness, reports it has gotten worse. Reports it is worse when she uses the CPAP machine. Reports she has intermittent episodes of spinning sensation. The spinning sensation can lasts a couple minutes. Denies with bed mobility, more when she is up and moving. Report she feels light headed all the time. Denies falls. Patient denies hearing/vision changes. Did have cataract surgery on L eye in February 2023. Reports it happens  very frequently. Using approx about 1/2 Meclizine in the morning and at night to help with symptoms.  ?Pt accompanied by: self ? ?PERTINENT HISTORY: thoracic aortic aneurysm , patent ductus arteriosus s/p repair, hyperlipidemia, depression, IBS, chronic fatigue syndrome, OSA on CPAP, hx of labyrinthitis ? ? ?PAIN:  ?Are you having pain? No ? ?PRECAUTIONS: None ? ?WEIGHT BEARING RESTRICTIONS No ? ?FALLS: Has patient fallen in last 6 months? No ? ?LIVING ENVIRONMENT: ?Lives with: lives with their spouse ?Lives in: House/apartment ?Stairs: Yes; Internal: one flight steps; can reach both ?Has following equipment at home: None ? ?PLOF: Independent with household mobility without device, Independent with community mobility without device, and Leisure: Travel for [employer]; Like to work out in the yard ? ?PATIENT GOALS "Want to have a clear head"  ? ?OBJECTIVE:  ? ?DIAGNOSTIC FINDINGS: MRI Scheduled for March 25th  ? ?COGNITION: ?Overall cognitive status: Within functional limits for tasks assessed ?  ?SENSATION: ?Light touch: WFL ? ?POSTURE: forward head ? ? ?STRENGTH: WFL ? ?MMT:  ? ?MMT Right ?05/08/2021 Left ?05/08/2021  ?Hip flexion    ?Hip abduction    ?Hip adduction    ?Hip internal rotation    ?Hip external rotation    ?Knee flexion    ?Knee extension    ?Ankle dorsiflexion    ?Ankle plantarflexion    ?Ankle inversion    ?Ankle eversion    ?(Blank rows = not tested) ? ? ?TRANSFERS: ?Assistive device utilized: None  ?Sit to stand: Modified independence ?Stand to sit: Modified independence ? ? ?GAIT: ?Gait pattern: step through pattern ?Distance walked: clinic distance ?Assistive device utilized: None ?Level of assistance: Modified independence ?Comments: no significant unsteadiness noted, mild antalgic gait due to hip pain ? ? ?PATIENT SURVEYS:  ?FOTO DPS: 51 and DFS: 50.5 ? ? ?VESTIBULAR ASSESSMENT ? ?  ? SYMPTOM BEHAVIOR: ?  Subjective history: See Subjective ?  Non-Vestibular symptoms: tinnitus; reports  tinnitus has not been recently; Has some pain in the L ear, reports sharp occasional pain. Reports some fullness and the ear is itchy. ?  Type of dizziness: Blurred Vision, Spinning/Vertigo, Unsteady with head/body turns, and Lightheadedness/Faint ?  Frequency: Daily ?  Duration: Minutes ?  Aggravating factors: Induced by position change: sit to stand, Induced by motion: occur when walking, and Worse outside or in busy environment ?  Relieving factors: rest and slow movements ?  Progression of symptoms: unchanged ? ? OCULOMOTOR EXAM: ?  Ocular Alignment: normal ?  Ocular ROM: No Limitations ?  Spontaneous Nystagmus: absent ?  Gaze-Induced Nystagmus: age appropriate nystagmus at end range ?  Smooth Pursuits: intact;; no dizziness ?  Saccades: hypometric/undershoots ? ? ? ? VESTIBULAR - OCULAR REFLEX:  ?  Slow VOR: Comment: unable to test due to not allowing PROM  ?  VOR Cancellation: Comment: unable to test  due to not allowing PROM ?  Head-Impulse Test: unable to test ?  Dynamic Visual Acuity: Static: 9 ?Dynamic: 7 reports feeling drunk after completion of testing ?  ? POSITIONAL TESTING: Right Roll Test: none; Duration: 0 ?Left Roll Test: none; Duration: 0 ?Right Sidelying: none; Duration: 0 ?Left Sidelying: none; Duration: 0 ?  ?  ? ?MOTION SENSITIVITY: ? ?  Motion Sensitivity Quotient ? ?Intensity: 0 = none, 1 = Lightheaded, 2 = Mild, 3 = Moderate, 4 = Severe, 5 = Vomiting ? Intensity  ?1. Sitting to supine 0  ?2. Supine to L side 0 (reports feeling unsteady)   ?3. Supine to R side 0  ?4. Supine to sitting 1  ?5. L [place]pike[place]Dix   ?6. Up from L    ?7. R Hallpike-Dix   ?8. Up from R    ?9. Sitting, head  ?tipped to L knee   ?10. Head up from L  ?knee   ?11. Sitting, head  ?tipped to R knee   ?12. Head up from R  ?knee   ?13. Sitting head turns x5   ?14.Sitting head nods x5   ?15. In stance, 180?  ?turn to L    ?16. In stance, 180?  ?turn to R   ?  ?OTHOSTATICS: Supine: 130/87 with HR 69, Seated 120/85 with HR: 75  (with mild lightheadedness), Standing (at 1 minute mark): 130/83 with HR 80, Standing (at 3 minute mark): 151/90 with HR 85.  ? ?FUNCTIONAL GAIT:  to be assessed at future session ? ? ?PATIENT EDUCATION: ?Edu

## 2021-05-10 ENCOUNTER — Telehealth: Payer: Self-pay | Admitting: Physician Assistant

## 2021-05-10 NOTE — Telephone Encounter (Signed)
Pt called in stating Isabel Fleming wanted her to see an ENT, but everywhere she has called cannot see her until May or June. She would to see if we have anyone to recommend? She has tried [facility] and Dr. Claiborne Rigg, and Dr. Royden Purl. Dr. Royden Purl didn't take her insurance. ?

## 2021-05-11 ENCOUNTER — Other Ambulatory Visit: Payer: Self-pay

## 2021-05-11 ENCOUNTER — Ambulatory Visit
Admission: RE | Admit: 2021-05-11 | Discharge: 2021-05-11 | Disposition: A | Discharge status: Home / Self Care | Payer: Medicare HMO | Source: Ambulatory Visit | Attending: Physician Assistant | Admitting: Physician Assistant

## 2021-05-11 DIAGNOSIS — R42 Dizziness and giddiness: Secondary | ICD-10-CM | POA: Diagnosis not present

## 2021-05-13 NOTE — Telephone Encounter (Signed)
Patient advised will ask to be placed on cancellation list. Patient thanked me for calling.  ?

## 2021-05-23 ENCOUNTER — Ambulatory Visit: Payer: Medicare HMO | Attending: Physician Assistant

## 2021-05-23 DIAGNOSIS — R2681 Unsteadiness on feet: Secondary | ICD-10-CM | POA: Insufficient documentation

## 2021-05-23 DIAGNOSIS — R42 Dizziness and giddiness: Secondary | ICD-10-CM | POA: Diagnosis not present

## 2021-05-23 NOTE — Therapy (Signed)
?OUTPATIENT PHYSICAL THERAPY VESTIBULAR TREATMENT NOTE ? ? ?Patient Name: Isabel Fleming ?MRN: [id number] ?DOB:1945/04/15, 76 y.o., female ?Today's Date: 05/23/2021 ? ?PCP: Scifres, Dorothy, PA-C ?REFERRING PROVIDER: Rondel Jumbo, PA-C ? ? PT End of Session - 05/23/21 0946   ? ? Visit Number 2   ? Number of Visits 5   ? Date for PT Re-Evaluation 06/14/21   ? Authorization Type [employer]   ? Progress Note Due on Visit 10   ? PT Start Time 0945   Pt arrived late  ? Activity Tolerance Patient tolerated treatment well   ? Behavior During Therapy [facility] for tasks assessed/performed   ? ?  ?  ? ?  ? ? ?Past Medical History:  ?Diagnosis Date  ? Aortic atherosclerosis ([place]) 02/21/2019  ? With mild aortic root dilation (4.5 mm by echo, 4.1 mm a sending aorta by CTA)  ? Complication of anesthesia   ? "went beserk after 1st teflon removal surgery"  ? Depression   ? Not taking medication  ? Dyslipidemia, goal LDL below 100   ? GERD (gastroesophageal reflux disease)   ? Takes intermittent PPI  ? History of vertigo   ? Moderate obesity   ? BMI 38  ? OSA on CPAP   ? cpap setting of 3, pt uses some nights  ? 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 one vocal cord  ? Patent ductus arteriosus 45 yrs ago  ? repaired age 63yrold  ? Wears dentures   ? top  ? ?Past Surgical History:  ?Procedure Laterality Date  ? ABDOMINAL HYSTERECTOMY  yrs ago  ? COLONOSCOPY WITH PROPOFOL  01/06/2012  ? Procedure: COLONOSCOPY WITH PROPOFOL;  Surgeon: MGarlan Fair MD;  Location: [facility];  Service: Endoscopy;  Laterality: N/A;  ? CORONARY CT ANGIOGRAM  03/2018  ? Calcium score 0.  No significant disease noted.  4.1 mm centimeter ascending thoracic aneurysm.  Recommend annual follow-up.  Areas of old granulomatous disease and lung scarring bilaterally.  No effusions or confluent opacities.  ? DILATION AND CURETTAGE OF UTERUS    ? KNEE ARTHROSCOPY WITH MEDIAL MENISECTOMY Left 07/15/2012  ? Procedure: LEFT KNEE ARTHROSCOPY WITH  MEDIAL/LATERAL MENISECTOMY, PATELLA CHONDROPLASTY;  Surgeon: DNinetta Lights MD;  [place]  Service: Orthopedics;  Laterality: Left;  ? PATENT DUCTUS ARTERIOUS REPAIR  45 yrs ago-1969  ? poly removed from vocal cord  4 1/2 yrs ago  ? SHOULDER ARTHROSCOPY WITH SUBACROMIAL DECOMPRESSION, ROTATOR CUFF REPAIR AND BICEP TENDON REPAIR Right 05/12/2013  ? Procedure: RIGHT SHOULDER ARTHROSCOPY WITH DEBRIDEMENT ROTATOR CUFF, RELEASE BICEPS, DISTAL CLAVICLE RESECTION, SUBACROMIAL DECOMPRESSION;  Surgeon: DNinetta Lights MD;  [place]  Service: Orthopedics;  Laterality: Right;  ? surgery for paralyzed vocal cord    ? x 2, teflon injections done, some teflon removed 5 yrs ago x 2  ? TRANSTHORACIC ECHOCARDIOGRAM  03/14/2018  ? EF 60 to 65%.  GR 1 DD.  Aortic sclerosis, no stenosis.  Mild aortic root dilation 4.5 mm.  Referred for CT scan  ? ?Patient Active Problem List  ? Diagnosis Date Noted  ? Thoracic aortic aneurysm ([place] 03/16/2019  ? [place] 02/21/2019  ? DOE (dyspnea on exertion) 02/21/2019  ? [place]ical angina [place] 02/21/2019  ? Patent ductus arteriosus-status post repair   ? Dyslipidemia, goal LDL below 100   ? Impingement syndrome of right shoulder 05/12/2013  ? ? ?ONSET DATE: 04/24/21 (Referral  Date) ? ?REFERRING DIAG: R42 (ICD-10-CM) - Vertigo ? ?THERAPY DIAG:  ?No diagnosis found. ? ?PERTINENT HISTORY: thoracic aortic aneurysm , patent ductus arteriosus s/p repair, hyperlipidemia, depression, IBS, chronic fatigue syndrome, OSA on CPAP, hx of labyrinthitis ? ?PRECAUTIONS: None ? ?SUBJECTIVE: Had MRI completed, but not received results. Then has ENT appt in May. Pt reports that feels like not having dizziness as often. But reports had a bad day yesterday, reports she woke up not feeling well. ? ?PAIN:  ?Are you having pain? No ? ? ? ?OBJECTIVE:  ? ?Motion Sensitivity Quotient (All Bolded are Completed during Today's Visit) ? ?Intensity: 0 =  none, 1 = Lightheaded, 2 = Mild, 3 = Moderate, 4 = Severe, 5 = Vomiting ? Intensity  ?1. Sitting to supine 0  ?2. Supine to L side 0 (reports feeling unsteady)  ?3. Supine to R side 0  ?4. Supine to sitting 1  ?5. L Hallpike-Dix 0  ?6. Up from L  1  ?7. R Hallpike-Dix 0  ?8. Up from R  1  ?9. Sitting, head  ?tipped to L knee 0  ?10. Head up from L  ?knee 1  ?11. Sitting, head  ?tipped to R knee 0  ?12. Head up from R  ?knee 1  ?13. Sitting head turns x5 2 (reports feeling fuzzy)  ?14.Sitting head nods x5 2 (reports head feels shaky/funny)  ?15. In stance, 180?  ?turn to L  2  ?16. In stance, 180?  ?turn to R 0  ? ? [facility] PT Assessment - 05/23/21 0001   ? ?  ? Functional Gait  Assessment  ? Gait assessed  Yes   ? Gait Level Surface Walks 20 ft in less than 7 sec but greater than 5.5 sec, uses assistive device, slower speed, mild gait deviations, or deviates 6-10 in outside of the 12 in walkway width.   ? Change in Gait Speed Able to change speed, demonstrates mild gait deviations, deviates 6-10 in outside of the 12 in walkway width, or no gait deviations, unable to achieve a major change in velocity, or uses a change in velocity, or uses an assistive device.   ? Gait with Horizontal Head Turns Performs head turns smoothly with slight change in gait velocity (eg, minor disruption to smooth gait path), deviates 6-10 in outside 12 in walkway width, or uses an assistive device.   ? Gait with Vertical Head Turns Performs task with slight change in gait velocity (eg, minor disruption to smooth gait path), deviates 6 - 10 in outside 12 in walkway width or uses assistive device   ? Gait and Pivot Turn Pivot turns safely within 3 sec and stops quickly with no loss of balance.   ? Step Over Obstacle Is able to step over 2 stacked shoe boxes taped together (9 in total height) without changing gait speed. No evidence of imbalance.   ? Gait with Narrow Base of Support Ambulates 4-7 steps.   ? Gait with Eyes Closed Walks 20 ft,  slow speed, abnormal gait pattern, evidence for imbalance, deviates 10-15 in outside 12 in walkway width. Requires more than 9 sec to ambulate 20 ft.   ? Ambulating Backwards Walks 20 ft, uses assistive device, slower speed, mild gait deviations, deviates 6-10 in outside 12 in walkway width.   ? Steps Alternating feet, must use rail.   ? Total Score 20   ? FGA comment: 20/30   ? ?  ?  ? ?  ? ? ?  VESTIBULAR TREATMENT: ?   ?Gaze Adaptation: ?x1 Viewing Horizontal: Position: seated, Time: 30 seconds, Reps: 1, and Comment: reports mild dizziness and unsettled feeling and x1 Viewing Vertical:  Position: seated, Time: 2, Reps: 30 seconds, and Comment: mild dizziness, but reports worse than horizontal ? ?Provided HEP:  ?Gaze Stabilization: Sitting ? ? ? ?Keeping eyes on target on wall 3-4 feet away, tilt head down 15-30? and move head side to side for 30 seconds. Repeat while moving head up and down for 30 seconds. ?Do 2-3 sessions per day. ? ? ?Gaze Stabilization: Tip Card ? ?1.Target must remain in focus, not blurry, and appear stationary while head is in motion. ?2.Perform exercises with small head movements (45? to either side of midline). ?3.Increase speed of head motion so long as target is in focus. ?4.If you wear eyeglasses, be sure you can see target through lens (therapist will give specific instructions for bifocal / progressive lenses). ?5.These exercises may provoke dizziness or nausea. Work through these symptoms. If too dizzy, slow head movement slightly. Rest between each exercise. ?6.Exercises demand concentration; avoid distractions. ?7.For safety, perform standing exercises close to a counter, wall, corner, or next to someone. ? ?Copyright ? V[employer]. All rights reserved.  ? ?PATIENT EDUCATION: ?Education details: VOR HEP ?Person educated: Patient ?Education method: Explanation ?Education comprehension: verbalized understanding ?  ?  ?GOALS: ?Goals reviewed with patient? Yes ?  ?SHORT TERM GOALS = LONG TERM  GOALS ?  ?  ?LONG TERM GOALS: Target date: 06/12/2021 ?  ?Pt will be independent with final HEP for improved balance/vestibular  ?Baseline: no HEP established ?Goal status: INITIAL ?  ?2.  Pt will report </= 1/5

## 2021-05-23 NOTE — Patient Instructions (Signed)
Gaze Stabilization: Sitting ° ° ° °Keeping eyes on target on wall 3-4 feet away, tilt head down 15-30° and move head side to side for 30 seconds. Repeat while moving head up and down for 30 seconds. °Do 2-3 sessions per day. ° ° °Gaze Stabilization: Tip Card ° °1.Target must remain in focus, not blurry, and appear stationary while head is in motion. °2.Perform exercises with small head movements (45° to either side of midline). °3.Increase speed of head motion so long as target is in focus. °4.If you wear eyeglasses, be sure you can see target through lens (therapist will give specific instructions for bifocal / progressive lenses). °5.These exercises may provoke dizziness or nausea. Work through these symptoms. If too dizzy, slow head movement slightly. Rest between each exercise. °6.Exercises demand concentration; avoid distractions. °7.For safety, perform standing exercises close to a counter, wall, corner, or next to someone. ° °Copyright © VHI. All rights reserved.  ° °

## 2021-05-28 ENCOUNTER — Ambulatory Visit: Payer: Medicare HMO | Admitting: Physician Assistant

## 2021-05-28 ENCOUNTER — Encounter: Payer: Self-pay | Admitting: Physician Assistant

## 2021-05-28 VITALS — BP 146/82 | HR 88 | Resp 18 | Wt 226.0 lb

## 2021-05-28 DIAGNOSIS — R42 Dizziness and giddiness: Secondary | ICD-10-CM

## 2021-05-28 NOTE — Progress Notes (Addendum)
? ?Assessment/Plan:  ? ? ?Chronic  Vertigo, unclear etiology ?Unclear etiology, however, the patient has significant cardiac history, including aortic aneurysm and PDA s/p repair as well as a history of labyrinthitis . 2 D echo EF 60-65 percent, without significant changes from prior, MRI/MRA of the brain and neck without acute findings. She has an appt with ENT and she is on vestibular therapy with some improvement of symptoms.  ? ?Continue meclizine prn ?Awaiting referral  to ENT ?Continue Vestibular therapy  ?Follow up in 6 months  ?Use a cane to prevents falls ?  ? ? ?Case discussed with Dr. Tomi Likens who agrees with the plan ? ? ? ? ?Subjective:  ? ? ?Isabel Fleming is a very pleasant 76 y.o. RH female  with a history of thoracic aortic aneurysm , patent ductus arteriosus s/p repair, hyperlipidemia, depression, IBS, chronic fatigue syndrome, OSA on CPAP presenting for follow up evaluation of chronic vertigo. This patient is here alone. Previous records as well as any outside records available were reviewed prior to todays visit.  Patient was last seen at our office on 04/24/21 and sine then she had a battery of studies including a negative MRI brain and Echo. She began 1/4 scheduled vestibular therapy sessions, with minimal improvement of her symptoms although "learning to do the exercises" She is also experiencing less frequency of vertigo. She reports having a ENT appt on May 30th in [place]. She continues to feel that  ? she is floating.  She denies any history of migraines or stroke.  She states that if she turns her head to the left may be worse so she avoids sleeping on her left or moving her head to the left. She is learning via VT that he has to "focus on the "X on the wall" and move my eyes in a special way to provoke the symptoms so I can cure them".  She denies any nausea or vomiting.  Sometimes she takes meclizine, with some help.  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  She denies any recent upper respiratory infection or flulike symptoms.  She does feel that her gait is unsteady if she stands for longer periods of time or if she stands up too quickly. She states that she has vocal cord paralysis followed at [facility], and in the past she had Teflon implanted in her vocal cords, "but they are spreading, and the doctor wants to remove them, I have an appointment soon ".  She denies any dysarthria.  No confusion or seizures.  Denies any chest pain or shortness of breath, fever or chills, or night sweats.  She denies any tobacco, new medications or hormonal supplements.  She denies any recent long distance trips or recent surgeries, or sick contacts.  No new stressors in her personal life.  She is compliant with the medications. ? ? ? ?  ?Initial Visit  04/24/21 Isabel Fleming is a 76 y.o. R-handed female with a history of thoracic aortic aneurysm , patent ductus arteriosus s/p repair, hyperlipidemia, depression, IBS, chronic fatigue syndrome, OSA on CPAP presenting for evaluation of chronic vertigo.  She reports that the symptoms have been present for about 3 to 4 years, and has been seen at [facility] several years ago, with the same complaint, felt to be due to allergies "I was given some nasal spray and it did not help much ".  "I have good and bad days, when if I  do not move my head I am okay ".  She always feels as she is floating.  She denies any history of migraines or stroke.  She states that if she turns her head to the left may be worse so she avoids sleeping on her left or moving her head to the left.  Initially she thought that "the sugars had something to do with it so I always carry something sweet in my purse ".  She denies any nausea or vomiting.  Sometimes she takes meclizine, with some help.  She has chronic left neck pain believed to be due to arthritis, occasionally taking Flexeril.  She denies any recent upper respiratory infection or flulike  symptoms.  She does feel that her gait is unsteady if she stands for longer periods of time or if she stands up too quickly she had a recent cataract surgery on February 16, which may have exacerbated her symptoms "makes my head crazy ".  She states that she has vocal cord paralysis followed at [facility], and in the past she had Teflon implanted in her vocal cords, "but they are spreading, and the doctor wants to remove them, I have an appointment soon ".  She denies any dysarthria.  No confusion or seizures.  Denies any chest pain or shortness of breath, fever or chills, or night sweats.  She denies any tobacco, new medications or hormonal supplements.  She denies any recent long distance trips or recent surgeries, or sick contacts.  No new stressors in her personal life.  She is compliant with the medications. ? ?MRI MRA brain MRA neck 05/11/21 No acute intracranial process. ?2. No intracranial large vessel occlusion. Multifocal irregularity in the right V4 segment, which remains patent. No other significant intracranial stenosis. ?3. No hemodynamically significant stenosis in the neck, although visualization of the origins of vertebral arteries is limited by artifact. ?  ? ? ?PREVIOUS MEDICATIONS:  ? ?CURRENT MEDICATIONS:  ?Outpatient Encounter Medications as of 05/28/2021  ?Medication Sig  ? atorvastatin (LIPITOR) 10 MG tablet Take 10 mg by mouth daily.  ? bisacodyl (DULCOLAX) 5 MG EC tablet Take 1 tablet (5 mg total) by mouth daily as needed for moderate constipation.  ? ibuprofen (ADVIL,MOTRIN) 200 MG tablet Take 200 mg by mouth every 6 (six) hours as needed. For pain  ? Multiple Vitamin (MULTIVITAMIN WITH MINERALS) TABS Take 1 tablet by mouth daily.  ? ondansetron (ZOFRAN) 4 MG tablet Take 1 tablet (4 mg total) by mouth every 8 (eight) hours as needed for nausea or vomiting.  ? sodium chloride (OCEAN) 0.65 % nasal spray Place 1 spray into the nose daily as needed. For nasal decongestant  ? vitamin B-12  (CYANOCOBALAMIN) 1000 MCG tablet Take 1,000 mcg by mouth daily.  ? metoprolol tartrate (LOPRESSOR) 50 MG tablet Take 2 tablets (100 mg total) by mouth once for 1 dose. TAKE ONE HOUR PRIOR TO  SCHEDULE CARDIAC TEST (Patient not taking: Reported on 04/24/2021)  ? ?No facility-administered encounter medications on file as of 05/28/2021.  ? ? ? ?Objective:  ?  ? ?PHYSICAL EXAMINATION:   ? ?VITALS:   ?Vitals:  ? 05/28/21 0939  ?BP: (!) 146/82  ?Pulse: 88  ?Resp: 18  ?SpO2: 97%  ?Weight: 226 lb (102.5 kg)  ? ? ?GEN:  The patient appears stated age and is in NAD. ?HEENT:  Normocephalic, atraumatic.  ? ?Neurological examination: ? ?General: NAD, well-groomed, appears stated age. ?Orientation: The patient is alert. Oriented to person, place and date ?Cranial  nerves: There is good facial symmetry.The speech is fluent and clear. No aphasia or dysarthria. Fund of knowledge is appropriate. Recent and remote memory are impaired. Attention and concentration are reduced.  Able to name objects and repeat phrases.  Hearing is intact to conversational tone.    ?Sensation: Sensation is intact to light touch throughout ?Motor: Strength is at least antigravity x4. ?Tremors: none  ?DTR's 2/4 in UE/LE  ? ? ?  ?Movement examination: ?Tone: There is normal tone in the UE/LE ?Abnormal movements:  no tremor.  No myoclonus.  No asterixis.   ?Coordination:  There is no decremation with RAM's. Normal finger to nose  ?Gait and Station: The patient has no difficulty arising out of a deep-seated chair without the use of the hands. The patient's stride length is good.  Gait is cautious and narrow.  ? ? ?Total time spent on today's visit was 30 minutes, including both face-to-face time and nonface-to-face time. Time included that spent on review of records (prior notes available to me/labs/imaging if pertinent), discussing treatment and goals, answering patient's questions and coordinating care. ? ?Cc:  Scifres, Dorothy, PA-C ?Sharene Butters, PA-C   ?

## 2021-05-28 NOTE — Patient Instructions (Signed)
Great to see you ! You are doing great! ? ?Continue meclizine as needed ?Keep the ENT visit ?Continue  Vestibular Therapy  ?Wear a cane for balance and to prevent falls  ?Follow up in 6 months  ? ? ? ? ?

## 2021-05-29 ENCOUNTER — Ambulatory Visit: Payer: Medicare HMO | Admitting: Physical Therapy

## 2021-05-29 ENCOUNTER — Encounter: Payer: Self-pay | Admitting: Physical Therapy

## 2021-05-29 DIAGNOSIS — R42 Dizziness and giddiness: Secondary | ICD-10-CM

## 2021-05-29 DIAGNOSIS — R2681 Unsteadiness on feet: Secondary | ICD-10-CM | POA: Diagnosis not present

## 2021-05-29 NOTE — Patient Instructions (Signed)
?  ?  Access Code: [email or web address] ?URL: [email or web address] ?Date: 05/29/2021 ?Prepared by: Janann August ? ?Exercises ?- Romberg Stance Eyes Closed on Foam Pad  - 2 x daily - 5 x weekly - 3 sets - 30 reps ?- Narrow Stance with Eyes Closed and Head Rotation on Foam Pad  - 1 x daily - 5 x weekly - 2 sets - 10 reps ?- Walking with Eyes Closed and Counter Support  - 2 x daily - 5 x weekly - 3 sets ?- Tandem Walking with Counter Support  - 2 x daily - 5 x weekly - 3 sets ?

## 2021-05-29 NOTE — Therapy (Signed)
?OUTPATIENT PHYSICAL THERAPY VESTIBULAR TREATMENT NOTE ? ? ?Patient Name: Isabel Fleming ?MRN: [id number] ?DOB:08/03/1945, 76 y.o., female ?Today's Date: 05/29/2021 ? ?PCP: Scifres, Dorothy, PA-C ?REFERRING PROVIDER: Scifres, Dorothy, PA-C ? ? PT End of Session - 05/29/21 0931   ? ? Visit Number 3   ? Number of Visits 5   ? Date for PT Re-Evaluation 06/14/21   ? Authorization Type [employer] Medicare   ? Progress Note Due on Visit 10   ? PT Start Time 0930   ? PT Stop Time 1013   ? PT Time Calculation (min) 43 min   ? Activity Tolerance Patient tolerated treatment well   ? Behavior During Therapy [facility] for tasks assessed/performed   ? ?  ?  ? ?  ? ? ?Past Medical History:  ?Diagnosis Date  ? Aortic atherosclerosis ([place]) 02/21/2019  ? With mild aortic root dilation (4.5 mm by echo, 4.1 mm a sending aorta by CTA)  ? Complication of anesthesia   ? "went beserk after 1st teflon removal surgery"  ? Depression   ? Not taking medication  ? Dyslipidemia, goal LDL below 100   ? GERD (gastroesophageal reflux disease)   ? Takes intermittent PPI  ? History of vertigo   ? Moderate obesity   ? BMI 38  ? OSA on CPAP   ? cpap setting of 3, pt uses some nights  ? 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 one vocal cord  ? Patent ductus arteriosus 45 yrs ago  ? repaired age 55yrold  ? Wears dentures   ? top  ? ?Past Surgical History:  ?Procedure Laterality Date  ? ABDOMINAL HYSTERECTOMY  yrs ago  ? COLONOSCOPY WITH PROPOFOL  01/06/2012  ? Procedure: COLONOSCOPY WITH PROPOFOL;  Surgeon: MGarlan Fair MD;  Location: [facility];  Service: Endoscopy;  Laterality: N/A;  ? CORONARY CT ANGIOGRAM  03/2018  ? Calcium score 0.  No significant disease noted.  4.1 mm centimeter ascending thoracic aneurysm.  Recommend annual follow-up.  Areas of old granulomatous disease and lung scarring bilaterally.  No effusions or confluent opacities.  ? DILATION AND CURETTAGE OF UTERUS    ? KNEE ARTHROSCOPY WITH MEDIAL MENISECTOMY Left 07/15/2012   ? Procedure: LEFT KNEE ARTHROSCOPY WITH MEDIAL/LATERAL MENISECTOMY, PATELLA CHONDROPLASTY;  Surgeon: DNinetta Lights MD;  [place]  Service: Orthopedics;  Laterality: Left;  ? PATENT DUCTUS ARTERIOUS REPAIR  45 yrs ago-1969  ? poly removed from vocal cord  4 1/2 yrs ago  ? SHOULDER ARTHROSCOPY WITH SUBACROMIAL DECOMPRESSION, ROTATOR CUFF REPAIR AND BICEP TENDON REPAIR Right 05/12/2013  ? Procedure: RIGHT SHOULDER ARTHROSCOPY WITH DEBRIDEMENT ROTATOR CUFF, RELEASE BICEPS, DISTAL CLAVICLE RESECTION, SUBACROMIAL DECOMPRESSION;  Surgeon: DNinetta Lights MD;  [place]  Service: Orthopedics;  Laterality: Right;  ? surgery for paralyzed vocal cord    ? x 2, teflon injections done, some teflon removed 5 yrs ago x 2  ? TRANSTHORACIC ECHOCARDIOGRAM  03/14/2018  ? EF 60 to 65%.  GR 1 DD.  Aortic sclerosis, no stenosis.  Mild aortic root dilation 4.5 mm.  Referred for CT scan  ? ?Patient Active Problem List  ? Diagnosis Date Noted  ? Thoracic aortic aneurysm ([place] 03/16/2019  ? [place] 02/21/2019  ? DOE (dyspnea on exertion) 02/21/2019  ? Atypical angina ([place] 02/21/2019  ? Patent ductus arteriosus-status post repair   ? Dyslipidemia, goal LDL below 100   ? Impingement syndrome  of right shoulder 05/12/2013  ? ? ?ONSET DATE: 04/24/21 (Referral Date) ? ?REFERRING DIAG: R42 (ICD-10-CM) - Vertigo ? ?THERAPY DIAG:  ?Dizziness and giddiness ? ?Unsteadiness on feet ? ?PERTINENT HISTORY: thoracic aortic aneurysm , patent ductus arteriosus s/p repair, hyperlipidemia, depression, IBS, chronic fatigue syndrome, OSA on CPAP, hx of labyrinthitis ? ?Results from MRI on 05/13/21: ?IMPRESSION: ?1.  No acute intracranial process. ?2. No intracranial large vessel occlusion. Multifocal irregularity ?in the right V4 segment, which remains patent. No other significant ?intracranial stenosis. ?3. No hemodynamically significant stenosis in the neck, although ?visualization of  the origins of vertebral arteries is limited by ?artifact. ? ?PRECAUTIONS: None ? ?SUBJECTIVE: Reports she got the results of her MRI and they didn't find anything. MRI/MRA of the brain and neck without acute findings. Exercises are going well at home. Feeling ok from a dizzy standpoint today.  ? ?PAIN:  ?Are you having pain? No ? ? ? ?VESTIBULAR TREATMENT: ?   ?Gaze Adaptation: ?x1 Viewing Horizontal: Position: seated, Time: 30 seconds, Reps: 2, and Comment: reports mild dizziness and "fuzzy" feeling and  ? ?x1 Viewing Vertical:  Position: seated, Time: 2, Reps: 30 seconds, and Comment: mild dizziness and a "fuzzy" feeling ? ?Reviewed from HEP.  ? ?Standing Balance: ?Surface: Pillows ?Position: Narrow Base of Support ?Completed with: Eyes Open and Eyes Closed; Head Turns x 10 Reps and Head Nods x 10 Reps ? ?Feet together static stance with EC: 3 x 30 seconds  ? ?On rockerboard w/ EO: weight shifting x15 reps - incr difficulty in the posterior direction. Then head turns 2 sets of 10 reps, head nods 2 sets of 10 reps. Mild dizziness w/ head motions with first set, then pt just reported imbalance with 2nd set. Intermittent UE support for balance.  ?  ?  ?Access Code: [email or web address] ?URL: [email or web address] ?Date: 05/29/2021 ?Prepared by: Janann August ? ?Initiated balance HEP:  ? ?Exercises ?- Romberg Stance Eyes Closed on Foam Pad  - 2 x daily - 5 x weekly - 3 sets - 30 reps ?- Narrow Stance with Eyes Closed and Head Rotation on Foam Pad  - 1 x daily - 5 x weekly - 2 sets - 10 reps - also performed 2 sets of 10 reps head nods. Incr postural sway w/ nods ?- Walking with Eyes Closed and Counter Support  - 2 x daily - 5 x weekly - 3 sets ?- Tandem Walking with Counter Support  - 2 x daily - 5 x weekly - 3 sets - forwards and backwards at counter ? ? ?PATIENT EDUCATION: ?Education details: Balance HEP, answered pt's questions about what therapy is working on in regards to dizziness and impairments noted from  eval and further assessments.  ?Person educated: Patient ?Education method: Explanation ?Education comprehension: verbalized understanding ? ? ?HEP: ?HEP: Access Code: [email or web address] ? ? ?Seated VOR x 1 for 30 seconds.  ?  ?  ?GOALS: ?Goals reviewed with patient? Yes ?  ?SHORT TERM GOALS = LONG TERM GOALS ?  ?  ?LONG TERM GOALS: Target date: 06/12/2021 ?  ?Pt will be independent with final HEP for improved balance/vestibular  ?Baseline: no HEP established ?Goal status: INITIAL ?  ?2.  Pt will report </= 1/5 for all movements on MSQ to indicate improvement in motion sensitivity and improved activity tolerance.  ?Baseline: TBA ?Goal status: INITIAL ?  ?3.  Pt will improve FGA by 4 points from baseline to demonstrate improved balance and reduced fall risk ?Baseline: TBA ?Goal status: INITIAL ?  ?  4.  Patient will improve DPS to >/= 59% ?Baseline: 51% ?Goal status: INITIAL ?  ?  ?ASSESSMENT: ?  ?CLINICAL IMPRESSION: ?Today's skilled session focused on initiating HEP for balance impairments with head motions, EC, and narrow BOS. Reviewed VOR x1 when added from last session w/ pt continuing to report mild dizziness. Pt tolerated session well, will continue to progress towards LTGs.  ? ?  ?  ?OBJECTIVE IMPAIRMENTS decreased activity tolerance, decreased balance, difficulty walking, dizziness, and pain.  ?  ?ACTIVITY LIMITATIONS cleaning and yard work.  ?  ?PERSONAL FACTORS Age, Time since onset of injury/illness/exacerbation, and 3+ comorbidities: thoracic aortic aneurysm , patent ductus arteriosus s/p repair, hyperlipidemia, depression, IBS, chronic fatigue syndrome, OSA on CPAP, hx of labyrinthitis  are also affecting patient's functional outcome.  ?  ?  ?REHAB POTENTIAL: Fair   ?  ?CLINICAL DECISION MAKING: Stable/uncomplicated ?  ?EVALUATION COMPLEXITY: Low ?  ?  ?PLAN: ?PT FREQUENCY: 1x/week ?  ?PT DURATION: 4 weeks ?  ?PLANNED INTERVENTIONS: Therapeutic exercises, Therapeutic activity, Neuromuscular re-education, Balance  training, Gait training, Patient/Family education, Joint mobilization, Stair training, Vestibular training, and Canalith repositioning ?  ?PLAN FOR NEXT SESSION: Progress VOR x1.  How was balance HEP?

## 2021-06-04 ENCOUNTER — Ambulatory Visit: Payer: Medicare HMO | Admitting: Physical Therapy

## 2021-06-06 ENCOUNTER — Ambulatory Visit: Payer: Medicare HMO

## 2021-06-12 ENCOUNTER — Ambulatory Visit: Payer: Medicare HMO

## 2021-06-12 DIAGNOSIS — R42 Dizziness and giddiness: Secondary | ICD-10-CM

## 2021-06-12 DIAGNOSIS — R2681 Unsteadiness on feet: Secondary | ICD-10-CM | POA: Diagnosis not present

## 2021-06-12 NOTE — Therapy (Signed)
?OUTPATIENT PHYSICAL THERAPY VESTIBULAR TREATMENT NOTE/DISCHARGE SUMMARY ? ? ?Patient Name: Isabel Fleming ?MRN: [id number] ?DOB:1945-11-27, 76 y.o., female ?Today's Date: 06/12/2021 ? ?PCP: Scifres, Dorothy, PA-C ?REFERRING PROVIDER: Scifres, Dorothy, PA-C ? ?PHYSICAL THERAPY DISCHARGE SUMMARY ? ?Visits from Start of Care: 4 ? ?Current functional level related to goals / functional outcomes: ?See Clinical Impression ?  ?Remaining deficits: ?Lightheadedness ?  ?Education / Equipment: ?HEP Provided  ? ?Patient agrees to discharge. Patient goals were met. Patient is being discharged due to meeting the stated rehab goals. ? ? ? PT End of Session - 06/12/21 1016   ? ? Visit Number 4   ? Number of Visits 5   ? Date for PT Re-Evaluation 06/14/21   ? Authorization Type Aetna Medicare   ? Progress Note Due on Visit 10   ? PT Start Time 1015   ? PT Stop Time 1051   ? PT Time Calculation (min) 36 min   ? Activity Tolerance Patient tolerated treatment well   ? Behavior During Therapy [facility] for tasks assessed/performed   ? ?  ?  ? ?  ? ? ?Past Medical History:  ?Diagnosis Date  ? Aortic atherosclerosis ([place]) 02/21/2019  ? With mild aortic root dilation (4.5 mm by echo, 4.1 mm a sending aorta by CTA)  ? Complication of anesthesia   ? "went beserk after 1st teflon removal surgery"  ? Depression   ? Not taking medication  ? Dyslipidemia, goal LDL below 100   ? GERD (gastroesophageal reflux disease)   ? Takes intermittent PPI  ? History of vertigo   ? Moderate obesity   ? BMI 38  ? OSA on CPAP   ? cpap setting of 3, pt uses some nights  ? 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 one vocal cord  ? Patent ductus arteriosus 45 yrs ago  ? repaired age 25yr old  ? Wears dentures   ? top  ? ?Past Surgical History:  ?Procedure Laterality Date  ? ABDOMINAL HYSTERECTOMY  yrs ago  ? COLONOSCOPY WITH PROPOFOL  01/06/2012  ? Procedure: COLONOSCOPY WITH PROPOFOL;  Surgeon: Garlan Fair, MD;  [facility];  Service:  Endoscopy;  Laterality: N/A;  ? CORONARY CT ANGIOGRAM  03/2018  ? Calcium score 0.  No significant disease noted.  4.1 mm centimeter ascending thoracic aneurysm.  Recommend annual follow-up.  Areas of old granulomatous disease and lung scarring bilaterally.  No effusions or confluent opacities.  ? DILATION AND CURETTAGE OF UTERUS    ? KNEE ARTHROSCOPY WITH MEDIAL MENISECTOMY Left 07/15/2012  ? Procedure: LEFT KNEE ARTHROSCOPY WITH MEDIAL/LATERAL MENISECTOMY, PATELLA CHONDROPLASTY;  Surgeon: Ninetta Lights, MD;  Location: [place];  Service: Orthopedics;  Laterality: Left;  ? PATENT DUCTUS ARTERIOUS REPAIR  45 yrs ago-1969  ? poly removed from vocal cord  4 1/2 yrs ago  ? SHOULDER ARTHROSCOPY WITH SUBACROMIAL DECOMPRESSION, ROTATOR CUFF REPAIR AND BICEP TENDON REPAIR Right 05/12/2013  ? Procedure: RIGHT SHOULDER ARTHROSCOPY WITH DEBRIDEMENT ROTATOR CUFF, RELEASE BICEPS, DISTAL CLAVICLE RESECTION, SUBACROMIAL DECOMPRESSION;  Surgeon: Ninetta Lights, MD;  Location: [place];  Service: Orthopedics;  Laterality: Right;  ? surgery for paralyzed vocal cord    ? x 2, teflon injections done, some teflon removed 5 yrs ago x 2  ? TRANSTHORACIC ECHOCARDIOGRAM  03/14/2018  ? EF 60 to 65%.  GR 1 DD.  Aortic sclerosis, no stenosis.  Mild aortic root dilation 4.5 mm.  Referred  for CT scan  ? ?Patient Active Problem List  ? Diagnosis Date Noted  ? Thoracic aortic aneurysm ([place]) 03/16/2019  ? Aortic atherosclerosis ([place]) 02/21/2019  ? DOE (dyspnea on exertion) 02/21/2019  ? Atypical angina ([place]) 02/21/2019  ? Patent ductus arteriosus-status post repair   ? Dyslipidemia, goal LDL below 100   ? Impingement syndrome of right shoulder 05/12/2013  ? ? ?ONSET DATE: 04/24/21 (Referral Date) ? ?REFERRING DIAG: R42 (ICD-10-CM) - Vertigo ? ?THERAPY DIAG:  ?Dizziness and giddiness ? ?Unsteadiness on feet ? ?PERTINENT HISTORY: thoracic aortic aneurysm , patent ductus arteriosus s/p repair, hyperlipidemia,  depression, IBS, chronic fatigue syndrome, OSA on CPAP, hx of labyrinthitis ? ?Results from MRI on 05/13/21: ?IMPRESSION: ?1.  No acute intracranial process. ?2. No intracranial large vessel occlusion. Multifocal irregularity ?in the right V4 segment, which remains patent. No other significant ?intracranial stenosis. ?3. No hemodynamically significant stenosis in the neck, although ?visualization of the origins of vertebral arteries is limited by ?artifact. ? ?PRECAUTIONS: None ? ?SUBJECTIVE: Patient reports has been doing good. Had a busy weekend. Dizziness has been doing well.  ? ?PAIN:  ?Are you having pain? No ? ? ? ?VESTIBULAR TREATMENT: ?Motion Sensitivity Quotient ? ?Intensity: 0 = none, 1 = Lightheaded, 2 = Mild, 3 = Moderate, 4 = Severe, 5 = Vomiting ? Intensity  ?1. Sitting to supine 0  ?2. Supine to L side 0  ?3. Supine to R side 0  ?4. Supine to sitting 1   ?5. L [place] 0  ?6. Up from L  0  ?7. R [place]pike-Dix 0  ?8. Up from R  0  ?9. Sitting, head  ?tipped to L knee 0  ?10. Head up from L  ?knee 0  ?11. Sitting, head  ?tipped to R knee 0  ?12. Head up from R  ?knee 0  ?13. Sitting head turns x5 0 feel like she needs to steady herself)  ?14.Sitting head nods x5 0  ?15. In stance, 180?  ?turn to L  0  ?16. In stance, 180?  ?turn to R 0  ? ?Foto: DPS: 61.1% ?  ?Gaze Adaptation: Reviewed and educated on progressing time.  New handout provided.  ? ?Gaze Stabilization: Sitting ? ? ? ?Keeping eyes on target on wall 3-4 feet away, tilt head down 15-30? and move head side to side for 30-60 seconds. Repeat while moving head up and down for 30-60 seconds. ?Do 2 sessions per day. ? ?Copyright ? VHI. All rights reserved.  ?  ?Reviewed entire HEP and updated; bolded are new additions:   ?Access Code: [email or web address] ?URL: [email or web address] ?Date: 06/12/2021 ?Prepared by: Baldomero Lamy ? ?Exercises ?- Romberg Stance Eyes Closed on Foam Pad  - 2 x daily - 5 x weekly - 3 sets - 30 reps ?- Narrow Stance  with Eyes Closed and Head Rotation on Foam Pad  - 1 x daily - 5 x weekly - 2 sets - 10 reps ?- Walking with Eyes Closed and Counter Support  - 2 x daily - 5 x weekly - 3 sets ?- Tandem Walking with Counter Support  - 2 x daily - 5 x weekly - 3 sets ?- Forward Walking with Half Turns  - 1 x daily - 5 x weekly - 2 sets - 4 reps ? ? ? [facility] PT Assessment - 06/12/21 0001   ? ?  ? Functional Gait  Assessment  ? Gait assessed  Yes   ? Gait Level Surface Walks 20 ft  in less than 7 sec but greater than 5.5 sec, uses assistive device, slower speed, mild gait deviations, or deviates 6-10 in outside of the 12 in walkway width.   ? Change in Gait Speed Able to smoothly change walking speed without loss of balance or gait deviation. Deviate no more than 6 in outside of the 12 in walkway width.   ? Gait with Horizontal Head Turns Performs head turns smoothly with no change in gait. Deviates no more than 6 in outside 12 in walkway width   ? Gait with Vertical Head Turns Performs head turns with no change in gait. Deviates no more than 6 in outside 12 in walkway width.   ? Gait and Pivot Turn Pivot turns safely within 3 sec and stops quickly with no loss of balance.   ? Step Over Obstacle Is able to step over 2 stacked shoe boxes taped together (9 in total height) without changing gait speed. No evidence of imbalance.   ? Gait with Narrow Base of Support Ambulates 7-9 steps.   ? Gait with Eyes Closed Walks 20 ft, uses assistive device, slower speed, mild gait deviations, deviates 6-10 in outside 12 in walkway width. Ambulates 20 ft in less than 9 sec but greater than 7 sec.   ? Ambulating Backwards Walks 20 ft, uses assistive device, slower speed, mild gait deviations, deviates 6-10 in outside 12 in walkway width.   ? Steps Alternating feet, must use rail.   ? Total Score 25   ? FGA comment: 25/30   ? ?  ?  ? ?  ? ? ?PATIENT EDUCATION: ?Education details: Progress toward LTGs; D/C this visit ?Person educated: Patient ?Education  method: Explanation ?Education comprehension: verbalized understanding ? ? ?HEP: ?Access Code: [email or web address] and VOR x 1 ? ?  ?  ?GOALS: ?Goals reviewed with patient? Yes ?  ?SHORT TERM GOALS = LONG TERM GOALS ?  ?  ?LONG

## 2021-06-12 NOTE — Patient Instructions (Signed)
Gaze Stabilization: Sitting ? ? ? ?Keeping eyes on target on wall 3-4 feet away, tilt head down 15-30? and move head side to side for 30-60 seconds. Repeat while moving head up and down for 30-60 seconds. ?Do 2 sessions per day. ? ?Copyright ? VHI. All rights reserved.  ? ?

## 2021-07-17 DIAGNOSIS — J310 Chronic rhinitis: Secondary | ICD-10-CM | POA: Diagnosis not present

## 2021-07-17 DIAGNOSIS — J324 Chronic pansinusitis: Secondary | ICD-10-CM | POA: Diagnosis not present

## 2021-07-17 DIAGNOSIS — G4733 Obstructive sleep apnea (adult) (pediatric): Secondary | ICD-10-CM | POA: Diagnosis not present

## 2021-07-17 DIAGNOSIS — R42 Dizziness and giddiness: Secondary | ICD-10-CM | POA: Diagnosis not present

## 2021-08-06 DIAGNOSIS — Z08 Encounter for follow-up examination after completed treatment for malignant neoplasm: Secondary | ICD-10-CM | POA: Diagnosis not present

## 2021-08-06 DIAGNOSIS — L821 Other seborrheic keratosis: Secondary | ICD-10-CM | POA: Diagnosis not present

## 2021-08-06 DIAGNOSIS — L814 Other melanin hyperpigmentation: Secondary | ICD-10-CM | POA: Diagnosis not present

## 2021-08-06 DIAGNOSIS — D2362 Other benign neoplasm of skin of left upper limb, including shoulder: Secondary | ICD-10-CM | POA: Diagnosis not present

## 2021-08-06 DIAGNOSIS — B354 Tinea corporis: Secondary | ICD-10-CM | POA: Diagnosis not present

## 2021-08-06 DIAGNOSIS — Z85828 Personal history of other malignant neoplasm of skin: Secondary | ICD-10-CM | POA: Diagnosis not present

## 2021-08-06 DIAGNOSIS — B353 Tinea pedis: Secondary | ICD-10-CM | POA: Diagnosis not present

## 2021-08-06 DIAGNOSIS — L718 Other rosacea: Secondary | ICD-10-CM | POA: Diagnosis not present

## 2021-09-17 DIAGNOSIS — J324 Chronic pansinusitis: Secondary | ICD-10-CM | POA: Diagnosis not present

## 2021-09-17 DIAGNOSIS — R600 Localized edema: Secondary | ICD-10-CM | POA: Diagnosis not present

## 2021-09-17 DIAGNOSIS — J310 Chronic rhinitis: Secondary | ICD-10-CM | POA: Diagnosis not present

## 2021-09-30 DIAGNOSIS — M1990 Unspecified osteoarthritis, unspecified site: Secondary | ICD-10-CM | POA: Diagnosis not present

## 2021-09-30 DIAGNOSIS — Z6837 Body mass index (BMI) 37.0-37.9, adult: Secondary | ICD-10-CM | POA: Diagnosis not present

## 2021-09-30 DIAGNOSIS — E785 Hyperlipidemia, unspecified: Secondary | ICD-10-CM | POA: Diagnosis not present

## 2021-09-30 DIAGNOSIS — R32 Unspecified urinary incontinence: Secondary | ICD-10-CM | POA: Diagnosis not present

## 2021-09-30 DIAGNOSIS — I2510 Atherosclerotic heart disease of native coronary artery without angina pectoris: Secondary | ICD-10-CM | POA: Diagnosis not present

## 2021-09-30 DIAGNOSIS — Z803 Family history of malignant neoplasm of breast: Secondary | ICD-10-CM | POA: Diagnosis not present

## 2021-09-30 DIAGNOSIS — R030 Elevated blood-pressure reading, without diagnosis of hypertension: Secondary | ICD-10-CM | POA: Diagnosis not present

## 2021-09-30 DIAGNOSIS — R69 Illness, unspecified: Secondary | ICD-10-CM | POA: Diagnosis not present

## 2021-09-30 DIAGNOSIS — G4733 Obstructive sleep apnea (adult) (pediatric): Secondary | ICD-10-CM | POA: Diagnosis not present

## 2021-09-30 DIAGNOSIS — J309 Allergic rhinitis, unspecified: Secondary | ICD-10-CM | POA: Diagnosis not present

## 2021-09-30 DIAGNOSIS — F439 Reaction to severe stress, unspecified: Secondary | ICD-10-CM | POA: Diagnosis not present

## 2021-10-22 DIAGNOSIS — Z6837 Body mass index (BMI) 37.0-37.9, adult: Secondary | ICD-10-CM | POA: Diagnosis not present

## 2021-10-22 DIAGNOSIS — J387 Other diseases of larynx: Secondary | ICD-10-CM | POA: Diagnosis not present

## 2021-10-22 DIAGNOSIS — R490 Dysphonia: Secondary | ICD-10-CM | POA: Diagnosis not present

## 2021-11-07 DIAGNOSIS — B351 Tinea unguium: Secondary | ICD-10-CM | POA: Diagnosis not present

## 2021-11-07 DIAGNOSIS — B354 Tinea corporis: Secondary | ICD-10-CM | POA: Diagnosis not present

## 2021-11-07 DIAGNOSIS — L821 Other seborrheic keratosis: Secondary | ICD-10-CM | POA: Diagnosis not present

## 2021-11-07 DIAGNOSIS — B353 Tinea pedis: Secondary | ICD-10-CM | POA: Diagnosis not present

## 2021-11-07 DIAGNOSIS — L718 Other rosacea: Secondary | ICD-10-CM | POA: Diagnosis not present

## 2021-11-26 NOTE — Progress Notes (Signed)
Assessment/Plan:    Chronic  Vertigo, unclear etiology Unclear etiology, however, the patient has significant cardiac history, including aortic aneurysm and PDA s/p repair as well as a history of labyrinthitis . 2 D echo EF 60-65 percent, without significant changes from prior, MRI/MRA of the brain and neck without acute findings. She has an appt with ENT and she is on vestibular therapy with some improvement of symptoms.   Continue meclizine prn Awaiting referral  to ENT Continue Vestibular therapy  Follow up in 6 months  Use a cane to prevents falls     Case discussed with Dr. Tomi Likens who agrees with the plan     Subjective:    Isabel Fleming is a very pleasant 76 y.o. RH female  with a history of thoracic aortic aneurysm , patent ductus arteriosus s/p repair, hyperlipidemia, depression, IBS, chronic fatigue syndrome, OSA on CPAP presenting for follow up evaluation of chronic vertigo. This patient is here alone. Previous records as well as any outside records available were reviewed prior to todays visit.  Patient was last seen at our office on 4/11/23and sine then she had a battery of studies including a negative MRI brain and Echo. ***She began 1/4 scheduled vestibular therapy sessions, with minimal improvement of her symptoms although "learning to do the exercises" She is also experiencing less frequency of vertigo. She reports having a ENT appt on May 30th in [place]. She continues to feel that she is floating.  She denies any history of migraines or stroke.  She states that if she turns her head to the left may be worse so she avoids sleeping on her left or moving her head to the left. She is learning via VT that he has to "focus on the "X on the wall" and move my eyes in a special way to provoke the symptoms so I can cure them".  She denies any nausea or vomiting.  Sometimes she takes meclizine, with some help.  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  She denies any recent upper respiratory infection or flulike symptoms.  She does feel that her gait is unsteady if she stands for longer periods of time or if she stands up too quickly. She states that she has vocal cord paralysis followed at [facility], and in the past she had Teflon implanted in her vocal cords, "but they are spreading, and the doctor wants to remove them, I have an appointment soon ".  She denies any dysarthria.  No confusion or seizures.  Denies any chest pain or shortness of breath, fever or chills, or night sweats.  She denies any tobacco, new medications or hormonal supplements.  She denies any recent long distance trips or recent surgeries, or sick contacts.  No new stressors in her personal life.  She is compliant with the medications.      Initial Visit  04/24/21 Isabel Fleming is a 76 y.o. R-handed female with a history of thoracic aortic aneurysm , patent ductus arteriosus s/p repair, hyperlipidemia, depression, IBS, chronic fatigue syndrome, OSA on CPAP presenting for evaluation of chronic vertigo.  She reports that the symptoms have been present for about 3 to 4 years, and has been seen at [facility] several years ago, with the same complaint, felt to be due to allergies "I was given some nasal spray and it did not help much ".  "I have good and bad days, when if I do not move  my head I am okay ".  She always feels as she is floating.  She denies any history of migraines or stroke.  She states that if she turns her head to the left may be worse so she avoids sleeping on her left or moving her head to the left.  Initially she thought that "the sugars had something to do with it so I always carry something sweet in my purse ".  She denies any nausea or vomiting.  Sometimes she takes meclizine, with some help.  She has chronic left neck pain believed to be due to arthritis, occasionally taking Flexeril.  She denies any recent upper respiratory infection or flulike  symptoms.  She does feel that her gait is unsteady if she stands for longer periods of time or if she stands up too quickly she had a recent cataract surgery on February 16, which may have exacerbated her symptoms "makes my head crazy ".  She states that she has vocal cord paralysis followed at [facility], and in the past she had Teflon implanted in her vocal cords, "but they are spreading, and the doctor wants to remove them, I have an appointment soon ".  She denies any dysarthria.  No confusion or seizures.  Denies any chest pain or shortness of breath, fever or chills, or night sweats.  She denies any tobacco, new medications or hormonal supplements.  She denies any recent long distance trips or recent surgeries, or sick contacts.  No new stressors in her personal life.  She is compliant with the medications.  MRI MRA brain MRA neck 05/11/21 No acute intracranial process. 2. No intracranial large vessel occlusion. Multifocal irregularity in the right V4 segment, which remains patent. No other significant intracranial stenosis. 3. No hemodynamically significant stenosis in the neck, although visualization of the origins of vertebral arteries is limited by artifact.     PREVIOUS MEDICATIONS:   CURRENT MEDICATIONS:  Outpatient Encounter Medications as of 11/27/2021  Medication Sig   atorvastatin (LIPITOR) 10 MG tablet Take 10 mg by mouth daily.   bisacodyl (DULCOLAX) 5 MG EC tablet Take 1 tablet (5 mg total) by mouth daily as needed for moderate constipation.   ibuprofen (ADVIL,MOTRIN) 200 MG tablet Take 200 mg by mouth every 6 (six) hours as needed. For pain   metoprolol tartrate (LOPRESSOR) 50 MG tablet Take 2 tablets (100 mg total) by mouth once for 1 dose. TAKE ONE HOUR PRIOR TO  SCHEDULE CARDIAC TEST (Patient not taking: Reported on 04/24/2021)   Multiple Vitamin (MULTIVITAMIN WITH MINERALS) TABS Take 1 tablet by mouth daily.   ondansetron (ZOFRAN) 4 MG tablet Take 1 tablet (4 mg total) by mouth every  8 (eight) hours as needed for nausea or vomiting.   sodium chloride (OCEAN) 0.65 % nasal spray Place 1 spray into the nose daily as needed. For nasal decongestant   vitamin B-12 (CYANOCOBALAMIN) 1000 MCG tablet Take 1,000 mcg by mouth daily.   No facility-administered encounter medications on file as of 11/27/2021.     Objective:     PHYSICAL EXAMINATION:    VITALS:   There were no vitals filed for this visit.   GEN:  The patient appears stated age and is in NAD. HEENT:  Normocephalic, atraumatic.   Neurological examination:  General: NAD, well-groomed, appears stated age. Orientation: The patient is alert. Oriented to person, place and date Cranial nerves: There is good facial symmetry.The speech is fluent and clear. No aphasia or dysarthria. Fund of knowledge is appropriate.  Recent and remote memory are impaired. Attention and concentration are reduced.  Able to name objects and repeat phrases.  Hearing is intact to conversational tone.    Sensation: Sensation is intact to light touch throughout Motor: Strength is at least antigravity x4. Tremors: none  DTR's 2/4 in UE/LE      Movement examination: Tone: There is normal tone in the UE/LE Abnormal movements:  no tremor.  No myoclonus.  No asterixis.   Coordination:  There is no decremation with RAM's. Normal finger to nose  Gait and Station: The patient has no difficulty arising out of a deep-seated chair without the use of the hands. The patient's stride length is good.  Gait is cautious and narrow.    Total time spent on today's visit was 30 minutes, including both face-to-face time and nonface-to-face time. Time included that spent on review of records (prior notes available to me/labs/imaging if pertinent), discussing treatment and goals, answering patient's questions and coordinating care.  Cc:  Scifres, Dorothy, PA-C (Inactive) Sharene Butters, PA-C

## 2021-11-27 ENCOUNTER — Encounter: Payer: Self-pay | Admitting: Physician Assistant

## 2021-11-27 ENCOUNTER — Ambulatory Visit: Payer: Medicare HMO | Admitting: Physician Assistant

## 2021-11-27 VITALS — BP 116/70 | HR 81 | Resp 18 | Ht 65.0 in | Wt 223.0 lb

## 2021-11-27 DIAGNOSIS — R42 Dizziness and giddiness: Secondary | ICD-10-CM | POA: Diagnosis not present

## 2021-11-27 NOTE — Patient Instructions (Signed)
Great to see you ! You are doing great!  Continue meclizine as needed Continue Vestibular Therapy  Wear a cane for balance and to prevent falls  Follow up as needed

## 2021-12-02 DIAGNOSIS — R490 Dysphonia: Secondary | ICD-10-CM | POA: Diagnosis not present

## 2021-12-02 DIAGNOSIS — M47812 Spondylosis without myelopathy or radiculopathy, cervical region: Secondary | ICD-10-CM | POA: Diagnosis not present

## 2021-12-02 DIAGNOSIS — J387 Other diseases of larynx: Secondary | ICD-10-CM | POA: Diagnosis not present

## 2021-12-10 DIAGNOSIS — J387 Other diseases of larynx: Secondary | ICD-10-CM | POA: Diagnosis not present

## 2022-01-30 ENCOUNTER — Emergency Department (HOSPITAL_COMMUNITY): Payer: Medicare HMO

## 2022-01-30 ENCOUNTER — Encounter (HOSPITAL_COMMUNITY): Payer: Self-pay | Admitting: Emergency Medicine

## 2022-01-30 ENCOUNTER — Other Ambulatory Visit: Payer: Self-pay

## 2022-01-30 ENCOUNTER — Emergency Department (HOSPITAL_COMMUNITY)
Admission: EM | Admit: 2022-01-30 | Discharge: 2022-01-30 | Disposition: A | Discharge status: Home / Self Care | Payer: Medicare HMO | Attending: Emergency Medicine | Admitting: Emergency Medicine

## 2022-01-30 DIAGNOSIS — M5432 Sciatica, left side: Secondary | ICD-10-CM | POA: Diagnosis not present

## 2022-01-30 DIAGNOSIS — M545 Low back pain, unspecified: Secondary | ICD-10-CM | POA: Diagnosis not present

## 2022-01-30 DIAGNOSIS — M5442 Lumbago with sciatica, left side: Secondary | ICD-10-CM | POA: Diagnosis not present

## 2022-01-30 MED ORDER — PREDNISONE 20 MG PO TABS: 40.0000 mg | ORAL_TABLET | Freq: Every day | ORAL | 0 refills | Status: AC

## 2022-01-30 MED ORDER — DEXAMETHASONE SODIUM PHOSPHATE 10 MG/ML IJ SOLN
10.0000 mg | Freq: Once | INTRAMUSCULAR | Status: AC
  Administered 2022-01-30: 10 mg via INTRAMUSCULAR
  Filled 2022-01-30: qty 1

## 2022-01-30 MED ORDER — LIDOCAINE 5 % EX PTCH: 1.0000 | MEDICATED_PATCH | CUTANEOUS | 0 refills | Status: AC

## 2022-01-30 MED ORDER — LIDOCAINE 5 % EX PTCH
1.0000 | MEDICATED_PATCH | Freq: Once | CUTANEOUS | Status: DC
  Administered 2022-01-30: 1 via TRANSDERMAL
  Filled 2022-01-30: qty 1

## 2022-01-30 MED ORDER — HYDROCODONE-ACETAMINOPHEN 5-325 MG PO TABS
2.0000 | ORAL_TABLET | Freq: Once | ORAL | Status: AC
  Administered 2022-01-30: 2 via ORAL
  Filled 2022-01-30: qty 2

## 2022-01-30 NOTE — Discharge Instructions (Addendum)
It was a pleasure taking care of you today!   Your CT did not show any concerning findings today.  You are prescribed prednisone and Lidoderm patch, take as directed.  You may take over-the-counter 500 mg Tylenol every 6 hours as needed for your symptoms and alternate with 600 mg ibuprofen every 6 hours as needed for no more than 7 days..  Take medications as prescribed.  Do not operate any heavy machinery or drive while taking Robaxin.  You may apply ice or heat to the affected area for up to 15 minutes at a time.  Ensure to place a barrier between your skin and the ice or heat.  Follow-up with Dr. Kathyrn Sheriff regarding today's ED visit.  You may follow-up with your primary care provider regarding today's ED visit.  Return to the Emergency Department if you are experiencing increasing/worsening symptoms.

## 2022-01-30 NOTE — ED Provider Triage Note (Signed)
Emergency Medicine Provider Triage Evaluation Note  PECOLA HAXTON , a 76 y.o. female  was evaluated in triage.  Pt complains of left sided back pain that radiates through her left buttock and down her leg to her toes.  She reports this pain began on Tuesday when she was moving some laundry and has progressively worsened to the point where it is painful and difficult to walk without assistance.  Denies fevers, urinary retention, loss of bladder or bowel control, or saddle anesthesia.   Review of Systems  Positive: See above Negative: See above  Physical Exam  BP (!) 151/96 (BP Location: Left Arm)   Pulse 94   Temp 98.5 F (36.9 C)   Resp 20   SpO2 94%  Gen:   Awake, no distress   Resp:  Normal effort  MSK:   Moves extremities without difficulty  Other:  Tenderness to palpation of lumbar spine and paraspinal region to the left  Medical Decision Making  Medically screening exam initiated at 12:58 PM.  Appropriate orders placed.  SUEANN BROWNLEY was informed that the remainder of the evaluation will be completed by another provider, this initial triage assessment does not replace that evaluation, and the importance of remaining in the ED until their evaluation is complete.     Theressa Stamps R, [place] 01/30/22 1300

## 2022-01-30 NOTE — ED Triage Notes (Signed)
Pt reports left sided back pain that radiates down left buttock and behind her left knee. Pt reports this pain started on Tuesday and has progressively gotten worse.

## 2022-01-30 NOTE — ED Provider Notes (Addendum)
[facility] DEPT Provider Note   CSN: [id number] Arrival date & time: 01/30/22  1230     History  Chief Complaint  Patient presents with   Back Pain    Isabel Fleming is a 76 y.o. female who presents to the ED with concerns for left lower back pain onset 3 days.  She notes that she was moving laundry when the symptoms began.  Denies fall or injury.  Patient notes that the left lower back pain radiates to her toes.  Denies past medical history of sciatica.  No meds tried prior to arrival.  Denies fever, urinary symptoms, bowel/bladder incontinence, saddle paresthesia.  No past medical history of diabetes.  The history is provided by the patient. No language interpreter was used.       Home Medications Prior to Admission medications   Medication Sig Start Date End Date Taking? Authorizing Provider  lidocaine (LIDODERM) 5 % Place 1 patch onto the skin daily. Remove & Discard patch within 12 hours or as directed by MD 01/30/22  Yes Mertice Uffelman A, PA-C  predniSONE (DELTASONE) 20 MG tablet Take 2 tablets (40 mg total) by mouth daily for 5 days. 01/30/22 02/04/22 Yes Mettie Roylance A, PA-C  atorvastatin (LIPITOR) 10 MG tablet Take 10 mg by mouth daily.    [provider]  bisacodyl (DULCOLAX) 5 MG EC tablet Take 1 tablet (5 mg total) by mouth daily as needed for moderate constipation. 05/12/13   Aundra Dubin, PA-C  ibuprofen (ADVIL,MOTRIN) 200 MG tablet Take 200 mg by mouth every 6 (six) hours as needed. For pain    [provider]  metoprolol tartrate (LOPRESSOR) 50 MG tablet Take 2 tablets (100 mg total) by mouth once for 1 dose. TAKE ONE HOUR PRIOR TO  SCHEDULE CARDIAC TEST Patient not taking: Reported on 04/24/2021 02/21/19 04/24/21  Leonie Man, MD  Multiple Vitamin (MULTIVITAMIN WITH MINERALS) TABS Take 1 tablet by mouth daily.    [provider]  ondansetron (ZOFRAN) 4 MG tablet Take 1 tablet (4 mg total) by mouth every 8  (eight) hours as needed for nausea or vomiting. 05/12/13   Aundra Dubin, PA-C  sodium chloride (OCEAN) 0.65 % nasal spray Place 1 spray into the nose daily as needed. For nasal decongestant    [provider]  vitamin B-12 (CYANOCOBALAMIN) 1000 MCG tablet Take 1,000 mcg by mouth daily.    [provider]      Allergies    Patient has no known allergies.    Review of Systems   Review of Systems  Musculoskeletal:  Positive for back pain.  All other systems reviewed and are negative.   Physical Exam Updated Vital Signs BP (!) 151/96 (BP Location: Left Arm)   Pulse 94   Temp 98.5 F (36.9 C)   Resp 20   SpO2 94%  Physical Exam Vitals and nursing note reviewed.  Constitutional:      General: She is not in acute distress.    Appearance: She is not diaphoretic.  HENT:     Head: Normocephalic and atraumatic.     Mouth/Throat:     Pharynx: No oropharyngeal exudate.  Eyes:     General: No scleral icterus.    Conjunctiva/sclera: Conjunctivae normal.  Cardiovascular:     Rate and Rhythm: Normal rate and regular rhythm.     Pulses: Normal pulses.     Heart sounds: Normal heart sounds.  Pulmonary:     Effort: Pulmonary  effort is normal. No respiratory distress.     Breath sounds: Normal breath sounds. No wheezing.  Abdominal:     General: Bowel sounds are normal.     Palpations: Abdomen is soft. There is no mass.     Tenderness: There is no abdominal tenderness. There is no guarding or rebound.  Musculoskeletal:        General: Normal range of motion.     Cervical back: Normal range of motion and neck supple.     Comments: Lumbar spinal tenderness to palpation.  Tenderness to palpation noted to the left lumbar musculature.  Positive straight leg raise on the left.  Skin:    General: Skin is warm and dry.  Neurological:     Mental Status: She is alert.  Psychiatric:        Behavior: Behavior normal.     ED Results / Procedures / Treatments   Labs (all  labs ordered are listed, but only abnormal results are displayed) Labs Reviewed - No data to display  EKG None  Radiology CT Lumbar Spine Wo Contrast  Result Date: 01/30/2022 CLINICAL DATA:  Low back pain. Increased fracture risk. Left buttock and leg pain. EXAM: CT LUMBAR SPINE WITHOUT CONTRAST TECHNIQUE: Multidetector CT imaging of the lumbar spine was performed without intravenous contrast administration. Multiplanar CT image reconstructions were also generated. RADIATION DOSE REDUCTION: This exam was performed according to the departmental dose-optimization program which includes automated exposure control, adjustment of the mA and/or kV according to patient size and/or use of iterative reconstruction technique. COMPARISON:  Lumbar MRI 01/26/2020 FINDINGS: Segmentation: Normal segmentation.  Five lumbar vertebra. Alignment: Mild anterolisthesis L3-4 otherwise normal alignment Vertebrae: Negative for fracture or mass Paraspinal and other soft tissues: Negative for paraspinous mass or adenopathy. Disc levels: L1-2: Mild facet degeneration.  Negative for stenosis L2-3: Mild facet degeneration.  Negative for stenosis L3-4: Moderate facet degeneration with mild anterolisthesis. Mild spinal stenosis L4-5: Disc degeneration with disc bulging and spurring. Bilateral facet hypertrophy. Mild spinal stenosis. Mild subarticular stenosis bilaterally L5-S1: Advanced facet degeneration left greater than right. Moderate subarticular and foraminal stenosis on the left with progression from the prior MRI. IMPRESSION: 1. Negative for lumbar fracture. 2. Multilevel lumbar degenerative change. Mild spinal stenosis L3-4 and L4-5. Moderate subarticular and foraminal stenosis on the left at L5-S1 due to facet degeneration. Electronically Signed   By: Franchot Gallo M.D.   On: 01/30/2022 13:39    Procedures Procedures    Medications Ordered in ED Medications  lidocaine (LIDODERM) 5 % 1 patch (1 patch Transdermal Patch  Applied 01/30/22 1502)  HYDROcodone-acetaminophen (NORCO/VICODIN) 5-325 MG per tablet 2 tablet (2 tablets Oral Given 01/30/22 1334)  dexamethasone (DECADRON) injection 10 mg (10 mg Intramuscular Given 01/30/22 1502)    ED Course/ Medical Decision Making/ A&P Clinical Course as of 01/30/22 1523  Thu Jan 30, 2022  1509 Re-evaluated [SB]    Clinical Course User Index [SB] Nyellie Yetter A, PA-C                           Medical Decision Making Risk Prescription drug management.   Patient with left lumbar back pain with radiation to her left leg posteriorly. No history of sciatica. No neurological deficits and normal neuro exam.  Patient is ambulatory with a crutch.  No loss of bowel or bladder control.  No concern for cauda equina. Pt afebrile On exam, patient with TTP noted to lumbar spine and left  lumbar musculature with positive straight leg raise on the left. No concerning findings on exams. Differential diagnosis includes but is not limited to fracture, herniation, muscle strain, sciatica.    Imaging: I ordered imaging studies including CT lumbar spine I independently visualized and interpreted imaging which showed:  1. Negative for lumbar fracture.  2. Multilevel lumbar degenerative change. Mild spinal stenosis L3-4  and L4-5. Moderate subarticular and foraminal stenosis on the left  at L5-S1 due to facet degeneration.   I agree with the radiologist interpretation  Medications:  I ordered medication including norco, decadron, warm compress, lidoderm patch for pain management Reevaluation of the patient after these medicines and interventions, I reevaluated the patient and found that they have improved I have reviewed the patients home medicines and have made adjustments as needed  Disposition: Presenting suspicious for likely sciatica.  Doubt fracture, dislocation, herniation at this time. After consideration of the diagnostic results and the patients response to treatment, I  feel that the patient would benefit from Discharge home.  Patient provided with prescription for prednisone and lidoderm patch.  Patient instructed to follow-up with her back specialist, Dr. Kathyrn Sheriff regarding today's ED visit.  She does have a scheduled appointment in January with Dr. Kathyrn Sheriff.  Discussed importance of follow-up with primary care provider for management.  Supportive care measures and strict return precautions discussed with patient at bedside. Pt acknowledges and verbalizes understanding. Pt appears safe for discharge. Follow up as indicated in discharge paperwork.    This chart was dictated using voice recognition software, Dragon. Despite the best efforts of this provider to proofread and correct errors, errors may still occur which can change documentation meaning.  Final Clinical Impression(s) / ED Diagnoses Final diagnoses:  Sciatica of left side    Rx / DC Orders ED Discharge Orders          Ordered    predniSONE (DELTASONE) 20 MG tablet  Daily        01/30/22 1454    lidocaine (LIDODERM) 5 %  Every 24 hours        01/30/22 1522              Clell Trahan A, PA-C 01/30/22 1523    Daylah Sayavong A, PA-C 01/30/22 1523    Hayden Rasmussen, MD 01/30/22 2044

## 2022-02-04 DIAGNOSIS — M5432 Sciatica, left side: Secondary | ICD-10-CM | POA: Diagnosis not present

## 2022-02-04 DIAGNOSIS — R030 Elevated blood-pressure reading, without diagnosis of hypertension: Secondary | ICD-10-CM | POA: Diagnosis not present

## 2022-02-20 DIAGNOSIS — M47816 Spondylosis without myelopathy or radiculopathy, lumbar region: Secondary | ICD-10-CM | POA: Diagnosis not present

## 2022-03-28 ENCOUNTER — Other Ambulatory Visit: Payer: Self-pay | Admitting: Physician Assistant

## 2022-03-28 ENCOUNTER — Other Ambulatory Visit: Payer: Self-pay | Admitting: Emergency Medicine

## 2022-03-28 DIAGNOSIS — Z1231 Encounter for screening mammogram for malignant neoplasm of breast: Secondary | ICD-10-CM

## 2022-04-15 DIAGNOSIS — M48061 Spinal stenosis, lumbar region without neurogenic claudication: Secondary | ICD-10-CM | POA: Diagnosis not present

## 2022-04-15 DIAGNOSIS — M47816 Spondylosis without myelopathy or radiculopathy, lumbar region: Secondary | ICD-10-CM | POA: Diagnosis not present

## 2022-04-15 DIAGNOSIS — R202 Paresthesia of skin: Secondary | ICD-10-CM | POA: Diagnosis not present

## 2022-04-15 DIAGNOSIS — M545 Low back pain, unspecified: Secondary | ICD-10-CM | POA: Diagnosis not present

## 2022-04-15 DIAGNOSIS — R200 Anesthesia of skin: Secondary | ICD-10-CM | POA: Diagnosis not present

## 2022-05-14 ENCOUNTER — Ambulatory Visit: Payer: Medicare HMO

## 2022-06-12 DIAGNOSIS — B353 Tinea pedis: Secondary | ICD-10-CM | POA: Diagnosis not present

## 2022-06-12 DIAGNOSIS — B351 Tinea unguium: Secondary | ICD-10-CM | POA: Diagnosis not present

## 2022-06-12 DIAGNOSIS — B354 Tinea corporis: Secondary | ICD-10-CM | POA: Diagnosis not present

## 2022-06-27 ENCOUNTER — Other Ambulatory Visit: Payer: Self-pay | Admitting: Family Medicine

## 2022-06-27 ENCOUNTER — Ambulatory Visit
Admission: RE | Admit: 2022-06-27 | Discharge: 2022-06-27 | Disposition: A | Discharge status: Home / Self Care | Payer: Medicare HMO | Source: Ambulatory Visit | Attending: Physician Assistant | Admitting: Physician Assistant

## 2022-06-27 DIAGNOSIS — Z1231 Encounter for screening mammogram for malignant neoplasm of breast: Secondary | ICD-10-CM | POA: Diagnosis not present

## 2022-09-03 DIAGNOSIS — B353 Tinea pedis: Secondary | ICD-10-CM | POA: Diagnosis not present

## 2022-09-03 DIAGNOSIS — L814 Other melanin hyperpigmentation: Secondary | ICD-10-CM | POA: Diagnosis not present

## 2022-09-03 DIAGNOSIS — L720 Epidermal cyst: Secondary | ICD-10-CM | POA: Diagnosis not present

## 2022-09-03 DIAGNOSIS — D225 Melanocytic nevi of trunk: Secondary | ICD-10-CM | POA: Diagnosis not present

## 2022-09-03 DIAGNOSIS — L728 Other follicular cysts of the skin and subcutaneous tissue: Secondary | ICD-10-CM | POA: Diagnosis not present

## 2022-09-03 DIAGNOSIS — D485 Neoplasm of uncertain behavior of skin: Secondary | ICD-10-CM | POA: Diagnosis not present

## 2022-09-03 DIAGNOSIS — B351 Tinea unguium: Secondary | ICD-10-CM | POA: Diagnosis not present

## 2022-09-03 DIAGNOSIS — L821 Other seborrheic keratosis: Secondary | ICD-10-CM | POA: Diagnosis not present

## 2022-09-18 DIAGNOSIS — L905 Scar conditions and fibrosis of skin: Secondary | ICD-10-CM | POA: Diagnosis not present

## 2022-09-18 DIAGNOSIS — D225 Melanocytic nevi of trunk: Secondary | ICD-10-CM | POA: Diagnosis not present

## 2022-10-07 DIAGNOSIS — R5383 Other fatigue: Secondary | ICD-10-CM | POA: Diagnosis not present

## 2022-10-07 DIAGNOSIS — Z8774 Personal history of (corrected) congenital malformations of heart and circulatory system: Secondary | ICD-10-CM | POA: Diagnosis not present

## 2022-10-07 DIAGNOSIS — F320 Major depressive disorder, single episode, mild: Secondary | ICD-10-CM | POA: Diagnosis not present

## 2022-10-07 DIAGNOSIS — E785 Hyperlipidemia, unspecified: Secondary | ICD-10-CM | POA: Diagnosis not present

## 2022-10-07 DIAGNOSIS — I712 Thoracic aortic aneurysm, without rupture, unspecified: Secondary | ICD-10-CM | POA: Diagnosis not present

## 2022-10-07 DIAGNOSIS — Z1211 Encounter for screening for malignant neoplasm of colon: Secondary | ICD-10-CM | POA: Diagnosis not present

## 2022-10-07 DIAGNOSIS — H6692 Otitis media, unspecified, left ear: Secondary | ICD-10-CM | POA: Diagnosis not present

## 2022-10-07 DIAGNOSIS — I700 Atherosclerosis of aorta: Secondary | ICD-10-CM | POA: Diagnosis not present

## 2022-10-07 DIAGNOSIS — Z0000 Encounter for general adult medical examination without abnormal findings: Secondary | ICD-10-CM | POA: Diagnosis not present

## 2022-10-07 DIAGNOSIS — G4733 Obstructive sleep apnea (adult) (pediatric): Secondary | ICD-10-CM | POA: Diagnosis not present

## 2022-10-07 DIAGNOSIS — M8580 Other specified disorders of bone density and structure, unspecified site: Secondary | ICD-10-CM | POA: Diagnosis not present

## 2022-10-09 DIAGNOSIS — S21201A Unspecified open wound of right back wall of thorax without penetration into thoracic cavity, initial encounter: Secondary | ICD-10-CM | POA: Diagnosis not present

## 2022-10-09 DIAGNOSIS — L231 Allergic contact dermatitis due to adhesives: Secondary | ICD-10-CM | POA: Diagnosis not present

## 2022-10-09 DIAGNOSIS — L821 Other seborrheic keratosis: Secondary | ICD-10-CM | POA: Diagnosis not present

## 2022-10-27 DIAGNOSIS — F325 Major depressive disorder, single episode, in full remission: Secondary | ICD-10-CM | POA: Diagnosis not present

## 2022-10-27 DIAGNOSIS — M5430 Sciatica, unspecified side: Secondary | ICD-10-CM | POA: Diagnosis not present

## 2022-10-27 DIAGNOSIS — Z008 Encounter for other general examination: Secondary | ICD-10-CM | POA: Diagnosis not present

## 2022-10-27 DIAGNOSIS — R32 Unspecified urinary incontinence: Secondary | ICD-10-CM | POA: Diagnosis not present

## 2022-10-27 DIAGNOSIS — J309 Allergic rhinitis, unspecified: Secondary | ICD-10-CM | POA: Diagnosis not present

## 2022-10-27 DIAGNOSIS — Z8249 Family history of ischemic heart disease and other diseases of the circulatory system: Secondary | ICD-10-CM | POA: Diagnosis not present

## 2022-10-27 DIAGNOSIS — R030 Elevated blood-pressure reading, without diagnosis of hypertension: Secondary | ICD-10-CM | POA: Diagnosis not present

## 2022-10-27 DIAGNOSIS — Z9181 History of falling: Secondary | ICD-10-CM | POA: Diagnosis not present

## 2022-10-27 DIAGNOSIS — E785 Hyperlipidemia, unspecified: Secondary | ICD-10-CM | POA: Diagnosis not present

## 2022-10-27 DIAGNOSIS — Z85828 Personal history of other malignant neoplasm of skin: Secondary | ICD-10-CM | POA: Diagnosis not present

## 2022-10-27 DIAGNOSIS — R2681 Unsteadiness on feet: Secondary | ICD-10-CM | POA: Diagnosis not present

## 2022-10-27 DIAGNOSIS — M1990 Unspecified osteoarthritis, unspecified site: Secondary | ICD-10-CM | POA: Diagnosis not present

## 2022-11-13 ENCOUNTER — Other Ambulatory Visit: Payer: Self-pay | Admitting: Family Medicine

## 2022-11-13 DIAGNOSIS — K581 Irritable bowel syndrome with constipation: Secondary | ICD-10-CM | POA: Diagnosis not present

## 2022-11-13 DIAGNOSIS — I712 Thoracic aortic aneurysm, without rupture, unspecified: Secondary | ICD-10-CM

## 2022-11-13 DIAGNOSIS — F32A Depression, unspecified: Secondary | ICD-10-CM | POA: Diagnosis not present

## 2022-12-05 ENCOUNTER — Ambulatory Visit
Admission: RE | Admit: 2022-12-05 | Discharge: 2022-12-05 | Disposition: A | Discharge status: Home / Self Care | Payer: Medicare HMO | Source: Ambulatory Visit | Attending: Family Medicine | Admitting: Family Medicine

## 2022-12-05 DIAGNOSIS — I712 Thoracic aortic aneurysm, without rupture, unspecified: Secondary | ICD-10-CM

## 2022-12-05 DIAGNOSIS — I7121 Aneurysm of the ascending aorta, without rupture: Secondary | ICD-10-CM | POA: Diagnosis not present

## 2022-12-05 MED ORDER — IOPAMIDOL (ISOVUE-370) INJECTION 76%
500.0000 mL | Freq: Once | INTRAVENOUS | Status: AC | PRN
  Administered 2022-12-05: 100 mL via INTRAVENOUS

## 2022-12-25 DIAGNOSIS — Z01419 Encounter for gynecological examination (general) (routine) without abnormal findings: Secondary | ICD-10-CM | POA: Diagnosis not present

## 2022-12-26 DIAGNOSIS — J209 Acute bronchitis, unspecified: Secondary | ICD-10-CM | POA: Diagnosis not present

## 2022-12-30 DIAGNOSIS — L728 Other follicular cysts of the skin and subcutaneous tissue: Secondary | ICD-10-CM | POA: Diagnosis not present

## 2022-12-30 DIAGNOSIS — D225 Melanocytic nevi of trunk: Secondary | ICD-10-CM | POA: Diagnosis not present

## 2022-12-30 DIAGNOSIS — L814 Other melanin hyperpigmentation: Secondary | ICD-10-CM | POA: Diagnosis not present

## 2022-12-30 DIAGNOSIS — L810 Postinflammatory hyperpigmentation: Secondary | ICD-10-CM | POA: Diagnosis not present

## 2022-12-30 DIAGNOSIS — L905 Scar conditions and fibrosis of skin: Secondary | ICD-10-CM | POA: Diagnosis not present

## 2022-12-30 DIAGNOSIS — Z872 Personal history of diseases of the skin and subcutaneous tissue: Secondary | ICD-10-CM | POA: Diagnosis not present

## 2022-12-30 DIAGNOSIS — Z09 Encounter for follow-up examination after completed treatment for conditions other than malignant neoplasm: Secondary | ICD-10-CM | POA: Diagnosis not present

## 2023-01-05 DIAGNOSIS — Z1211 Encounter for screening for malignant neoplasm of colon: Secondary | ICD-10-CM | POA: Diagnosis not present

## 2023-01-05 DIAGNOSIS — R1310 Dysphagia, unspecified: Secondary | ICD-10-CM | POA: Diagnosis not present

## 2023-01-31 DIAGNOSIS — R69 Illness, unspecified: Secondary | ICD-10-CM | POA: Diagnosis not present

## 2023-04-22 DIAGNOSIS — K2100 Gastro-esophageal reflux disease with esophagitis, without bleeding: Secondary | ICD-10-CM | POA: Diagnosis not present

## 2023-04-22 DIAGNOSIS — K6389 Other specified diseases of intestine: Secondary | ICD-10-CM | POA: Diagnosis not present

## 2023-04-22 DIAGNOSIS — K2970 Gastritis, unspecified, without bleeding: Secondary | ICD-10-CM | POA: Diagnosis not present

## 2023-04-22 DIAGNOSIS — Z1211 Encounter for screening for malignant neoplasm of colon: Secondary | ICD-10-CM | POA: Diagnosis not present

## 2023-04-22 DIAGNOSIS — K222 Esophageal obstruction: Secondary | ICD-10-CM | POA: Diagnosis not present

## 2023-04-22 DIAGNOSIS — D126 Benign neoplasm of colon, unspecified: Secondary | ICD-10-CM | POA: Diagnosis not present

## 2023-04-22 DIAGNOSIS — K449 Diaphragmatic hernia without obstruction or gangrene: Secondary | ICD-10-CM | POA: Diagnosis not present

## 2023-04-22 DIAGNOSIS — K2930 Chronic superficial gastritis without bleeding: Secondary | ICD-10-CM | POA: Diagnosis not present

## 2023-04-22 DIAGNOSIS — K2289 Other specified disease of esophagus: Secondary | ICD-10-CM | POA: Diagnosis not present

## 2023-04-22 DIAGNOSIS — K621 Rectal polyp: Secondary | ICD-10-CM | POA: Diagnosis not present

## 2023-04-22 DIAGNOSIS — D122 Benign neoplasm of ascending colon: Secondary | ICD-10-CM | POA: Diagnosis not present

## 2023-04-22 DIAGNOSIS — D123 Benign neoplasm of transverse colon: Secondary | ICD-10-CM | POA: Diagnosis not present

## 2023-05-04 DIAGNOSIS — G4733 Obstructive sleep apnea (adult) (pediatric): Secondary | ICD-10-CM | POA: Diagnosis not present

## 2023-05-04 DIAGNOSIS — R42 Dizziness and giddiness: Secondary | ICD-10-CM | POA: Diagnosis not present

## 2023-05-04 DIAGNOSIS — K582 Mixed irritable bowel syndrome: Secondary | ICD-10-CM | POA: Diagnosis not present

## 2023-05-04 DIAGNOSIS — M1990 Unspecified osteoarthritis, unspecified site: Secondary | ICD-10-CM | POA: Diagnosis not present

## 2023-05-04 DIAGNOSIS — E785 Hyperlipidemia, unspecified: Secondary | ICD-10-CM | POA: Diagnosis not present

## 2023-05-04 DIAGNOSIS — F3341 Major depressive disorder, recurrent, in partial remission: Secondary | ICD-10-CM | POA: Diagnosis not present

## 2023-05-04 DIAGNOSIS — K219 Gastro-esophageal reflux disease without esophagitis: Secondary | ICD-10-CM | POA: Diagnosis not present

## 2023-05-04 DIAGNOSIS — E669 Obesity, unspecified: Secondary | ICD-10-CM | POA: Diagnosis not present

## 2023-05-04 DIAGNOSIS — G8929 Other chronic pain: Secondary | ICD-10-CM | POA: Diagnosis not present

## 2023-05-14 DIAGNOSIS — R0981 Nasal congestion: Secondary | ICD-10-CM | POA: Diagnosis not present

## 2023-05-14 DIAGNOSIS — H66003 Acute suppurative otitis media without spontaneous rupture of ear drum, bilateral: Secondary | ICD-10-CM | POA: Diagnosis not present

## 2023-05-14 DIAGNOSIS — U071 COVID-19: Secondary | ICD-10-CM | POA: Diagnosis not present

## 2023-05-14 DIAGNOSIS — R509 Fever, unspecified: Secondary | ICD-10-CM | POA: Diagnosis not present

## 2023-05-14 DIAGNOSIS — R051 Acute cough: Secondary | ICD-10-CM | POA: Diagnosis not present

## 2023-06-10 DIAGNOSIS — M25562 Pain in left knee: Secondary | ICD-10-CM | POA: Diagnosis not present

## 2023-08-17 DIAGNOSIS — F32A Depression, unspecified: Secondary | ICD-10-CM | POA: Diagnosis not present

## 2023-08-17 DIAGNOSIS — F325 Major depressive disorder, single episode, in full remission: Secondary | ICD-10-CM | POA: Diagnosis not present

## 2023-08-17 DIAGNOSIS — E669 Obesity, unspecified: Secondary | ICD-10-CM | POA: Diagnosis not present

## 2023-09-08 DIAGNOSIS — D225 Melanocytic nevi of trunk: Secondary | ICD-10-CM | POA: Diagnosis not present

## 2023-09-08 DIAGNOSIS — B351 Tinea unguium: Secondary | ICD-10-CM | POA: Diagnosis not present

## 2023-09-08 DIAGNOSIS — L814 Other melanin hyperpigmentation: Secondary | ICD-10-CM | POA: Diagnosis not present

## 2023-09-08 DIAGNOSIS — B353 Tinea pedis: Secondary | ICD-10-CM | POA: Diagnosis not present

## 2023-09-08 DIAGNOSIS — L821 Other seborrheic keratosis: Secondary | ICD-10-CM | POA: Diagnosis not present

## 2023-09-08 DIAGNOSIS — B354 Tinea corporis: Secondary | ICD-10-CM | POA: Diagnosis not present

## 2023-09-08 DIAGNOSIS — L720 Epidermal cyst: Secondary | ICD-10-CM | POA: Diagnosis not present

## 2023-09-09 DIAGNOSIS — M1712 Unilateral primary osteoarthritis, left knee: Secondary | ICD-10-CM | POA: Diagnosis not present

## 2023-09-17 DIAGNOSIS — F32A Depression, unspecified: Secondary | ICD-10-CM | POA: Diagnosis not present

## 2023-09-17 DIAGNOSIS — F325 Major depressive disorder, single episode, in full remission: Secondary | ICD-10-CM | POA: Diagnosis not present

## 2023-09-17 DIAGNOSIS — E669 Obesity, unspecified: Secondary | ICD-10-CM | POA: Diagnosis not present

## 2023-09-30 DIAGNOSIS — G4733 Obstructive sleep apnea (adult) (pediatric): Secondary | ICD-10-CM | POA: Diagnosis not present

## 2023-09-30 DIAGNOSIS — G4719 Other hypersomnia: Secondary | ICD-10-CM | POA: Diagnosis not present

## 2023-10-01 DIAGNOSIS — H00014 Hordeolum externum left upper eyelid: Secondary | ICD-10-CM | POA: Diagnosis not present

## 2023-10-01 DIAGNOSIS — H01114 Allergic dermatitis of left upper eyelid: Secondary | ICD-10-CM | POA: Diagnosis not present

## 2023-10-08 DIAGNOSIS — G4733 Obstructive sleep apnea (adult) (pediatric): Secondary | ICD-10-CM | POA: Diagnosis not present

## 2023-10-13 DIAGNOSIS — M8580 Other specified disorders of bone density and structure, unspecified site: Secondary | ICD-10-CM | POA: Diagnosis not present

## 2023-10-13 DIAGNOSIS — I712 Thoracic aortic aneurysm, without rupture, unspecified: Secondary | ICD-10-CM | POA: Diagnosis not present

## 2023-10-13 DIAGNOSIS — G629 Polyneuropathy, unspecified: Secondary | ICD-10-CM | POA: Diagnosis not present

## 2023-10-13 DIAGNOSIS — F32A Depression, unspecified: Secondary | ICD-10-CM | POA: Diagnosis not present

## 2023-10-13 DIAGNOSIS — K760 Fatty (change of) liver, not elsewhere classified: Secondary | ICD-10-CM | POA: Diagnosis not present

## 2023-10-13 DIAGNOSIS — G4733 Obstructive sleep apnea (adult) (pediatric): Secondary | ICD-10-CM | POA: Diagnosis not present

## 2023-10-13 DIAGNOSIS — J3800 Paralysis of vocal cords and larynx, unspecified: Secondary | ICD-10-CM | POA: Diagnosis not present

## 2023-10-13 DIAGNOSIS — Z0000 Encounter for general adult medical examination without abnormal findings: Secondary | ICD-10-CM | POA: Diagnosis not present

## 2023-10-13 DIAGNOSIS — Z8774 Personal history of (corrected) congenital malformations of heart and circulatory system: Secondary | ICD-10-CM | POA: Diagnosis not present

## 2023-10-13 DIAGNOSIS — E669 Obesity, unspecified: Secondary | ICD-10-CM | POA: Diagnosis not present

## 2023-10-13 DIAGNOSIS — R252 Cramp and spasm: Secondary | ICD-10-CM | POA: Diagnosis not present

## 2023-10-13 DIAGNOSIS — E785 Hyperlipidemia, unspecified: Secondary | ICD-10-CM | POA: Diagnosis not present

## 2023-10-15 DIAGNOSIS — G4733 Obstructive sleep apnea (adult) (pediatric): Secondary | ICD-10-CM | POA: Diagnosis not present

## 2023-10-18 DIAGNOSIS — F325 Major depressive disorder, single episode, in full remission: Secondary | ICD-10-CM | POA: Diagnosis not present

## 2023-10-18 DIAGNOSIS — F32A Depression, unspecified: Secondary | ICD-10-CM | POA: Diagnosis not present

## 2023-10-18 DIAGNOSIS — E669 Obesity, unspecified: Secondary | ICD-10-CM | POA: Diagnosis not present

## 2023-10-29 DIAGNOSIS — G4733 Obstructive sleep apnea (adult) (pediatric): Secondary | ICD-10-CM | POA: Diagnosis not present

## 2023-11-06 DIAGNOSIS — G4733 Obstructive sleep apnea (adult) (pediatric): Secondary | ICD-10-CM | POA: Diagnosis not present

## 2023-11-13 DIAGNOSIS — L282 Other prurigo: Secondary | ICD-10-CM | POA: Diagnosis not present

## 2023-11-17 DIAGNOSIS — F325 Major depressive disorder, single episode, in full remission: Secondary | ICD-10-CM | POA: Diagnosis not present

## 2023-11-17 DIAGNOSIS — F32A Depression, unspecified: Secondary | ICD-10-CM | POA: Diagnosis not present

## 2023-11-17 DIAGNOSIS — E669 Obesity, unspecified: Secondary | ICD-10-CM | POA: Diagnosis not present

## 2023-11-18 DIAGNOSIS — Z780 Asymptomatic menopausal state: Secondary | ICD-10-CM | POA: Diagnosis not present

## 2023-11-23 ENCOUNTER — Other Ambulatory Visit: Payer: Self-pay | Admitting: Family Medicine

## 2023-11-23 DIAGNOSIS — Z1231 Encounter for screening mammogram for malignant neoplasm of breast: Secondary | ICD-10-CM

## 2023-11-28 DIAGNOSIS — G4733 Obstructive sleep apnea (adult) (pediatric): Secondary | ICD-10-CM | POA: Diagnosis not present

## 2023-12-03 ENCOUNTER — Ambulatory Visit
Admission: RE | Admit: 2023-12-03 | Discharge: 2023-12-03 | Disposition: A | Discharge status: Home / Self Care | Source: Ambulatory Visit | Attending: Family Medicine | Admitting: Family Medicine

## 2023-12-03 DIAGNOSIS — Z1231 Encounter for screening mammogram for malignant neoplasm of breast: Secondary | ICD-10-CM

## 2023-12-10 DIAGNOSIS — G4733 Obstructive sleep apnea (adult) (pediatric): Secondary | ICD-10-CM | POA: Diagnosis not present

## 2023-12-16 DIAGNOSIS — M1711 Unilateral primary osteoarthritis, right knee: Secondary | ICD-10-CM | POA: Diagnosis not present

## 2023-12-18 DIAGNOSIS — F32A Depression, unspecified: Secondary | ICD-10-CM | POA: Diagnosis not present

## 2023-12-18 DIAGNOSIS — E669 Obesity, unspecified: Secondary | ICD-10-CM | POA: Diagnosis not present

## 2023-12-18 DIAGNOSIS — F325 Major depressive disorder, single episode, in full remission: Secondary | ICD-10-CM | POA: Diagnosis not present

## 2023-12-29 DIAGNOSIS — G4733 Obstructive sleep apnea (adult) (pediatric): Secondary | ICD-10-CM | POA: Diagnosis not present

## 2024-01-17 DIAGNOSIS — F32A Depression, unspecified: Secondary | ICD-10-CM | POA: Diagnosis not present

## 2024-01-17 DIAGNOSIS — F325 Major depressive disorder, single episode, in full remission: Secondary | ICD-10-CM | POA: Diagnosis not present

## 2024-01-17 DIAGNOSIS — E669 Obesity, unspecified: Secondary | ICD-10-CM | POA: Diagnosis not present
